# Patient Record
Sex: Male | Born: 1940 | Race: White | Hispanic: No | Marital: Married | Smoking: Former smoker
Health system: Southern US, Community
[De-identification: ages and names within clinical notes are randomized; demographics above are authoritative.]

## PROBLEM LIST (undated history)

## (undated) DIAGNOSIS — I10 Essential (primary) hypertension: Secondary | ICD-10-CM

## (undated) HISTORY — PX: JOINT REPLACEMENT: SHX530

---

## 2005-10-25 ENCOUNTER — Ambulatory Visit: Payer: Self-pay | Admitting: Gastroenterology

## 2005-11-08 ENCOUNTER — Ambulatory Visit: Payer: Self-pay | Admitting: Gastroenterology

## 2006-09-23 ENCOUNTER — Inpatient Hospital Stay (HOSPITAL_COMMUNITY): Admission: RE | Admit: 2006-09-23 | Discharge: 2006-09-26 | Payer: Self-pay | Admitting: Orthopedic Surgery

## 2006-12-01 ENCOUNTER — Emergency Department (HOSPITAL_COMMUNITY): Admission: EM | Admit: 2006-12-01 | Discharge: 2006-12-01 | Payer: Self-pay | Admitting: Family Medicine

## 2007-07-23 ENCOUNTER — Inpatient Hospital Stay (HOSPITAL_COMMUNITY): Admission: RE | Admit: 2007-07-23 | Discharge: 2007-07-25 | Payer: Self-pay | Admitting: Orthopedic Surgery

## 2007-11-26 ENCOUNTER — Inpatient Hospital Stay (HOSPITAL_COMMUNITY): Admission: RE | Admit: 2007-11-26 | Discharge: 2007-11-29 | Payer: Self-pay | Admitting: Orthopedic Surgery

## 2008-01-06 ENCOUNTER — Encounter (INDEPENDENT_AMBULATORY_CARE_PROVIDER_SITE_OTHER): Payer: Self-pay | Admitting: Orthopedic Surgery

## 2008-01-06 ENCOUNTER — Inpatient Hospital Stay (HOSPITAL_COMMUNITY): Admission: RE | Admit: 2008-01-06 | Discharge: 2008-01-10 | Payer: Self-pay | Admitting: Orthopedic Surgery

## 2009-01-29 IMAGING — CR DG CHEST 1V PORT
1 series · 1 of 1 positions shown · non-contrast
Comparison: 11/26/2007

CLINICAL DATA: Postop from left knee arthroplasty.  PICC line
placement.

PORTABLE CHEST - 1 VIEW

[view not recorded]
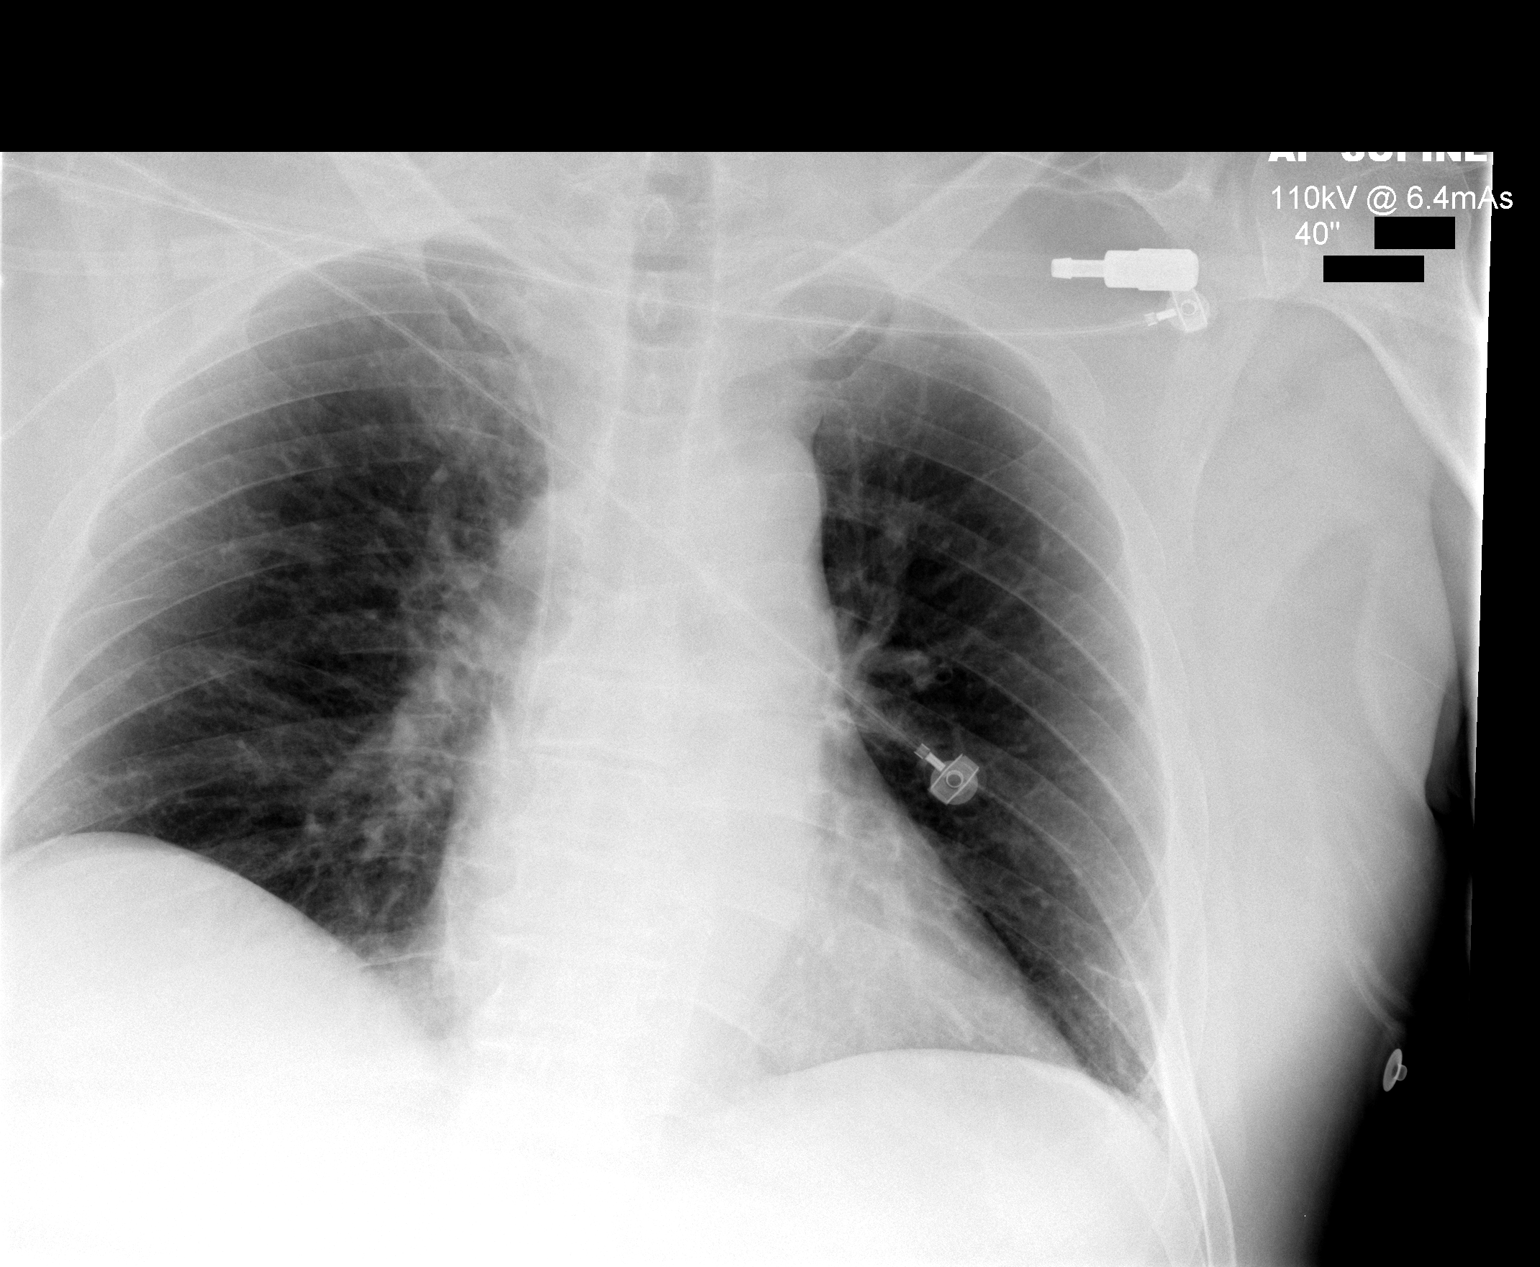

[1 of 1 positions shown; findings below may reference images not displayed]

FINDINGS: Right arm PICC line tip is seen in the expected region of
the cavoatrial junction.

Both lungs are clear.  Heart size is within normal limits.
IMPRESSION: No acute findings.  PICC line tip overlies the cavoatrial junction.

## 2009-06-23 ENCOUNTER — Emergency Department (HOSPITAL_COMMUNITY): Admission: EM | Admit: 2009-06-23 | Discharge: 2009-06-23 | Payer: Self-pay | Admitting: Emergency Medicine

## 2009-06-29 ENCOUNTER — Emergency Department (HOSPITAL_COMMUNITY): Admission: EM | Admit: 2009-06-29 | Discharge: 2009-06-29 | Payer: Self-pay | Admitting: Emergency Medicine

## 2009-06-30 ENCOUNTER — Inpatient Hospital Stay (HOSPITAL_COMMUNITY): Admission: EM | Admit: 2009-06-30 | Discharge: 2009-07-01 | Payer: Self-pay | Admitting: Emergency Medicine

## 2009-09-01 ENCOUNTER — Ambulatory Visit (HOSPITAL_COMMUNITY): Admission: RE | Admit: 2009-09-01 | Discharge: 2009-09-02 | Payer: Self-pay | Admitting: General Surgery

## 2009-09-01 ENCOUNTER — Encounter (INDEPENDENT_AMBULATORY_CARE_PROVIDER_SITE_OTHER): Payer: Self-pay | Admitting: General Surgery

## 2010-05-21 LAB — DIFFERENTIAL
Basophils Absolute: 0 10*3/uL (ref 0.0–0.1)
Basophils Relative: 0 % (ref 0–1)
Eosinophils Absolute: 0.1 10*3/uL (ref 0.0–0.7)
Monocytes Relative: 10 % (ref 3–12)
Neutro Abs: 3.8 10*3/uL (ref 1.7–7.7)
Neutrophils Relative %: 62 % (ref 43–77)

## 2010-05-21 LAB — COMPREHENSIVE METABOLIC PANEL
ALT: 21 U/L (ref 0–53)
Albumin: 3.8 g/dL (ref 3.5–5.2)
BUN: 19 mg/dL (ref 6–23)
GFR calc non Af Amer: >60 mL/min (ref >60)
Potassium: 4.4 mEq/L (ref 3.5–5.1)
Sodium: 138 mEq/L (ref 135–145)
Total Bilirubin: 0.8 mg/dL (ref 0.3–1.2)
Total Protein: 6.1 g/dL (ref 6.0–8.3)

## 2010-05-21 LAB — CBC
MCHC: 34 g/dL (ref 30.0–36.0)
MCV: 92.7 fL (ref 78.0–100.0)
RBC: 5.34 MIL/uL (ref 4.22–5.81)
RDW: 13.8 % (ref 11.5–15.5)
WBC: 6.2 10*3/uL (ref 4.0–10.5)

## 2010-05-23 LAB — COMPREHENSIVE METABOLIC PANEL
Albumin: 3.2 g/dL — ABNORMAL LOW (ref 3.5–5.2)
Albumin: 3.8 g/dL (ref 3.5–5.2)
Alkaline Phosphatase: 60 U/L (ref 39–117)
Alkaline Phosphatase: 64 U/L (ref 39–117)
BUN: 16 mg/dL (ref 6–23)
BUN: 18 mg/dL (ref 6–23)
CO2: 25 mEq/L (ref 19–32)
CO2: 28 mEq/L (ref 19–32)
Calcium: 9.8 mg/dL (ref 8.4–10.5)
Chloride: 101 mEq/L (ref 96–112)
Chloride: 101 mEq/L (ref 96–112)
Chloride: 102 mEq/L (ref 96–112)
Creatinine, Ser: 1.04 mg/dL (ref 0.4–1.5)
GFR calc non Af Amer: >60 mL/min (ref >60)
Glucose, Bld: 103 mg/dL — ABNORMAL HIGH (ref 70–99)
Glucose, Bld: 86 mg/dL (ref 70–99)
Potassium: 4 mEq/L (ref 3.5–5.1)
Potassium: 4.2 mEq/L (ref 3.5–5.1)
Sodium: 133 mEq/L — ABNORMAL LOW (ref 135–145)
Total Bilirubin: 1.1 mg/dL (ref 0.3–1.2)
Total Bilirubin: 1.2 mg/dL (ref 0.3–1.2)
Total Bilirubin: 1.2 mg/dL (ref 0.3–1.2)
Total Protein: 7 g/dL (ref 6.0–8.3)

## 2010-05-23 LAB — COMPREHENSIVE METABOLIC PANEL WITH GFR
ALT: 32 U/L (ref 0–53)
ALT: 51 U/L (ref 0–53)
AST: 27 U/L (ref 0–37)
AST: 47 U/L — ABNORMAL HIGH (ref 0–37)
Alkaline Phosphatase: 76 U/L (ref 39–117)
BUN: 19 mg/dL (ref 6–23)
CO2: 23 meq/L (ref 19–32)
Calcium: 8.7 mg/dL (ref 8.4–10.5)
Creatinine, Ser: 0.83 mg/dL (ref 0.4–1.5)
Creatinine, Ser: 0.91 mg/dL (ref 0.4–1.5)
GFR calc Af Amer: >60 mL/min (ref >60)
GFR calc Af Amer: >60 mL/min (ref >60)
GFR calc non Af Amer: >60 mL/min (ref >60)
GFR calc non Af Amer: >60 mL/min (ref >60)
Glucose, Bld: 113 mg/dL — ABNORMAL HIGH (ref 70–99)
Sodium: 134 meq/L — ABNORMAL LOW (ref 135–145)
Total Protein: 6.2 g/dL (ref 6.0–8.3)

## 2010-05-23 LAB — DIFFERENTIAL
Basophils Absolute: 0 10*3/uL (ref 0.0–0.1)
Basophils Absolute: 0.1 10*3/uL (ref 0.0–0.1)
Basophils Relative: 0 % (ref 0–1)
Basophils Relative: 1 % (ref 0–1)
Basophils Relative: 2 % — ABNORMAL HIGH (ref 0–1)
Eosinophils Absolute: 0.1 10*3/uL (ref 0.0–0.7)
Eosinophils Relative: 1 % (ref 0–5)
Lymphocytes Relative: 12 % (ref 12–46)
Lymphocytes Relative: 19 % (ref 12–46)
Lymphocytes Relative: 6 % — ABNORMAL LOW (ref 12–46)
Lymphs Abs: 1.1 K/uL (ref 0.7–4.0)
Monocytes Absolute: 0.7 10*3/uL (ref 0.1–1.0)
Monocytes Absolute: 0.8 10*3/uL (ref 0.1–1.0)
Monocytes Relative: 5 % (ref 3–12)
Monocytes Relative: 8 % (ref 3–12)
Neutro Abs: 13.9 10*3/uL — ABNORMAL HIGH (ref 1.7–7.7)
Neutro Abs: 5.1 10*3/uL (ref 1.7–7.7)
Neutro Abs: 6.9 10*3/uL (ref 1.7–7.7)
Neutrophils Relative %: 78 % — ABNORMAL HIGH (ref 43–77)
Neutrophils Relative %: 86 % — ABNORMAL HIGH (ref 43–77)

## 2010-05-23 LAB — CBC
HCT: 43.5 % (ref 39.0–52.0)
HCT: 47.5 % (ref 39.0–52.0)
Hemoglobin: 15.4 g/dL (ref 13.0–17.0)
Hemoglobin: 16.8 g/dL (ref 13.0–17.0)
MCHC: 35.5 g/dL (ref 30.0–36.0)
MCV: 91.1 fL (ref 78.0–100.0)
MCV: 92.1 fL (ref 78.0–100.0)
Platelets: 175 10*3/uL (ref 150–400)
Platelets: 243 10*3/uL (ref 150–400)
RBC: 4.72 MIL/uL (ref 4.22–5.81)
RBC: 5.22 MIL/uL (ref 4.22–5.81)
RBC: 5.8 MIL/uL (ref 4.22–5.81)
RDW: 12.4 % (ref 11.5–15.5)
RDW: 13.5 % (ref 11.5–15.5)
WBC: 7.5 10*3/uL (ref 4.0–10.5)
WBC: 8.9 10*3/uL (ref 4.0–10.5)

## 2010-05-23 LAB — URINALYSIS, ROUTINE W REFLEX MICROSCOPIC
Glucose, UA: NEGATIVE mg/dL
Glucose, UA: NEGATIVE mg/dL
Hgb urine dipstick: NEGATIVE
Ketones, ur: 15 mg/dL — AB
Nitrite: NEGATIVE
Nitrite: NEGATIVE
Protein, ur: NEGATIVE mg/dL
Specific Gravity, Urine: 1.04 — ABNORMAL HIGH (ref 1.005–1.030)
Urobilinogen, UA: 0.2 mg/dL (ref 0.0–1.0)
Urobilinogen, UA: 1 mg/dL (ref 0.0–1.0)
pH: 5.5 (ref 5.0–8.0)

## 2010-05-23 LAB — TSH: TSH: 2.356 u[IU]/mL (ref 0.350–4.500)

## 2010-05-23 LAB — TROPONIN I: Troponin I: 0.01 ng/mL (ref 0.00–0.06)

## 2010-05-23 LAB — POCT I-STAT, CHEM 8
BUN: 22 mg/dL (ref 6–23)
Chloride: 105 mEq/L (ref 96–112)
Glucose, Bld: 119 mg/dL — ABNORMAL HIGH (ref 70–99)
HCT: 57 % — ABNORMAL HIGH (ref 39.0–52.0)
Hemoglobin: 19.4 g/dL — ABNORMAL HIGH (ref 13.0–17.0)

## 2010-05-23 LAB — LACTIC ACID, PLASMA: Lactic Acid, Venous: 1.2 mmol/L (ref 0.5–2.2)

## 2010-05-23 LAB — CK TOTAL AND CKMB (NOT AT ARMC)
CK, MB: 4.4 ng/mL — ABNORMAL HIGH (ref 0.3–4.0)
Relative Index: INVALID (ref 0.0–2.5)
Total CK: 34 U/L (ref 7–232)

## 2010-05-23 LAB — LIPASE, BLOOD
Lipase: 50 U/L (ref 11–59)
Lipase: 69 U/L — ABNORMAL HIGH (ref 11–59)

## 2010-05-23 LAB — MAGNESIUM: Magnesium: 1.5 mg/dL (ref 1.5–2.5)

## 2010-07-18 NOTE — H&P (Signed)
[name], [name]                ACCOUNT NO.:  [id number]   MEDICAL RECORD NO.:  [id number]          PATIENT TYPE:  INP   LOCATION:  NA                           FACILITY:  [facility]   PHYSICIAN:  Ollen Gross, M.D.    DATE OF BIRTH:  07/20/1940   DATE OF ADMISSION:  11/26/2007  DATE OF DISCHARGE:                              HISTORY & PHYSICAL   CHIEF COMPLAINT:  Infected left total knee.   HISTORY OF PRESENT ILLNESS:  The patient is a 70 year old male well-  known to Dr. Ollen Gross, having previously undergone a left total  knee in the past.  Unfortunately, he had developed an infection and he  most recently underwent a poly exchange revision back in May 2009.  He  was treated with IV antibiotics during that time.  Unfortunately, his  course was complicated by a PICC line infection which grew out  Klebsiella pneumoniae.  He has been treated for that.  The knee  continues to have recurrent effusions and hemarthrosis requiring  aspirations.  It is felt to be due to a recurrent infection.  It is felt  he would best be served by undergoing a two-stage procedure and is  subsequently admitted for the first stage of the resection arthroplasty.  Risks and benefits have been discussed.  He elects to proceed with  surgery.   ALLERGIES:  No known drug allergies.   CURRENT MEDICATIONS:  Metoprolol, pravastatin, fish oil, baby aspirin,  Mobic, Tylenol, omeprazole, lisinopril.   PAST MEDICAL HISTORY:  1. Hypertension.  2. Chronic reflux esophagitis.  3. Hyperlipidemia.  4. Past history of Helicobacter pylori.  5. Recent history of a PICC line infection of Klebsiella pneumoniae      back in July 2009.   PAST SURGICAL HISTORY:  1. Left total knee arthroplasty, July 2008.  2. Poly exchange revision in May 2009.   SOCIAL HISTORY:  Married, retired.  Nonsmoker.  No alcohol.  Five  children.   FAMILY HISTORY:  Father with a history of hypertension.  Family history  also of heart  disease and cancer.   REVIEW OF SYSTEMS:  No fevers or chills, no night sweats.  Occasional  fatigue.  NEURO:  No seizure, syncope or paralysis.  RESPIRATORY:  No  shortness of breath, productive cough or hemoptysis.  CARDIOVASCULAR:  No chest pain, no orthopnea.  GI:  No nausea, vomiting, diarrhea or  constipation.  GU:  No dysuria, hematuria or discharge.  MUSCULOSKELETAL:  Left knee.   PHYSICAL EXAMINATION:  VITAL SIGNS:  Pulse 74, respirations 12, blood  pressure 118/72.  GENERAL:  A 70 year old white male, well-nourished, well-developed,  large frame, overweight, in no acute distress, accompanied by his wife.  HEENT:  Normocephalic, atraumatic.  Pupils equal, round, reactive.  Oropharynx clear.  EOMs intact.  NECK:  Supple.  CHEST:  Clear anterior and posterior chest wall.  No rhonchi, rales or  wheezing.  On the left lateral anterior chest wall up near the axilla of  the left arm he had a superficial boil recently.  This was lanced.  Still has an open  area with eschar and a small ara about 1 x 1.5 cm that  still has a superficial ulceration that is not completely healed at the  time of H&P (please note, he is going back to see his dermatologist  prior to his surgery).  HEART:  Regular rate and rhythm without murmur.  ABDOMEN:  Soft, round, protuberant abdomen.  Bowel sounds present.  RECTAL, BREASTS, GENITALIA:  Not done, not pertinent to present illness.  EXTREMITIES:  Left knee:  Massive effusion, warm to the touch.  Range of  motion 10 to 115.   IMPRESSION:  Infected left total knee.   PLAN:  The patient admitted to [facility] to undergo a  resection arthroplasty.  Surgery will be performed by Dr. Ollen Gross.      Alexzandrew L. Perkins, P.A.C.      Ollen Gross, M.D.  Electronically Signed    ALP/MEDQ  D:  11/25/2007  T:  11/26/2007  Job:  [id number]

## 2010-07-18 NOTE — H&P (Signed)
[name], [name]                ACCOUNT NO.:  [id number]   MEDICAL RECORD NO.:  [id number]          PATIENT TYPE:  INP   LOCATION:  NA                           FACILITY:  [facility]   PHYSICIAN:  Ollen Gross, M.D.    DATE OF BIRTH:  27-Feb-1941   DATE OF ADMISSION:  01/06/2008  DATE OF DISCHARGE:                              HISTORY & PHYSICAL   CHIEF COMPLAINT:  [name] is a 70 year old gentleman well-known to  Dr. Lequita Halt.  He has had an infected left total knee arthroplasty that  was removed earlier this summer.  Patient has been on vancomycin.  He is  currently being readmitted for surgical evaluation of the left knee and  possible reimplantation of his left total knee arthroplasty.   ALLERGIES:  NO KNOWN DRUG ALLERGIES.   CURRENT MEDICATIONS:  Metoprolol, pravastatin, Coumadin, Tylenol,  omeprazole, Vicodin and lisinopril.   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  2. Hypertension.  3. Reflux.  4. Hyperlipidemia  5. History of H.  Pylori.   PAST SURGICAL HISTORY:  Left total knee arthroplasty in July 2008.  He  had an infection in the knee in May 2009.  He was taken to the OR where  it was surgically debrided, had polyethylene exchange.  He subsequently  came back reinfected in September.  He was taken back to the OR where  the components were removed.  He had a polymethyl methacrylate spacer  with vancomycin impregnated and vancomycin IV since, and he is ready for  reimplantation.   SOCIAL HISTORY:  He is married.  He is a nonsmoker.  No alcohol.  Five  children, lives with his wife.   FAMILY MEDICAL HISTORY:  Father has hypertension.  Mother has heart  disease.   REVIEW OF SYSTEMS:  At this time, negative for any fevers or chills.  No  other recent illnesses.  He denies any chest pain, shortness of breath.  He denies any GI upset.  He has absolutely no symptoms related to any  flu.   PHYSICAL EXAMINATION:  VITAL SIGNS:   Temperature is 97.9, pulse was 70,  respirations 12, blood pressure was 110/68.  GENERAL:  This is a healthy-appearing gentleman, conscious, alert and  appropriate in a wheelchair.  He has left leg in an immobilizer brace,  appears to be in no distress.  Marland Kitchen  HEENT: Head was normocephalic.  Pupils equal, round and reactive.  Gross  hearing is intact.  NECK:  Supple.  Good range of motion.  CHEST:  Had clear lung sounds throughout.  No wheezes, rales, rhonchi.  HEART:  Regular rate and rhythm.  No murmurs.  ABDOMEN:  Soft, nontender.  Bowel sounds present.  EXTREMITIES:  Upper extremities had good range of motion of shoulders,  elbows and wrists.  He had a right-sided PICC-line placed.  LOWER EXTREMITIES:  Left knee was round, boggy appearing.  He had  dermatologic pigmentation changes about the knee, was slightly warmer  than the right.  There was no signs of infection.  It was in a knee  immobilizer.  His calf was soft and nontender.  His right knee had  normal motion.  PERIPHERAL VASCULAR:  Radial pulses were 2+, dorsalis pedis pulses were  1+.  NEUROLOGICAL:  The patient was conscious, alert and appropriate.   IMPRESSION:  1. History of infection of left total knee arthroplasty with recent      removal of the hardware with polymethyl methacrylate spacer and      vancomycin impregnated on vancomycin since mid September.  2. Hypertension.  3. Reflux disease.  4. Hyperlipidemia  5. History of H. Pylori.   PLAN:  The patient will be readmitted to [facility] under the  care of Dr. Ollen Gross.  The patient has undergone all routine labs  and tests.  The patient will have a surgical reevaluation of his left  knee with possible reimplantation of his left total knee arthroplasty by  Dr. Lequita Halt at [facility] on January 06, 2008.      Jamelle Rushing, P.A.      Ollen Gross, M.D.  Electronically Signed    RWK/MEDQ  D:  01/01/2008  T:  01/01/2008  Job:   [id number]   cc:   Ollen Gross, M.D.  Fax: [phone]

## 2010-07-18 NOTE — Op Note (Signed)
[name], [name]                ACCOUNT NO.:  [id number]   MEDICAL RECORD NO.:  [id number]          PATIENT TYPE:  AMB   LOCATION:  DAY                          FACILITY:  [facility]   PHYSICIAN:  Ollen Gross, M.D.    DATE OF BIRTH:  Jul 22, 1940   DATE OF PROCEDURE:  07/23/2007  DATE OF DISCHARGE:                               OPERATIVE REPORT   PREOPERATIVE DIAGNOSIS:  Infected left total knee arthroplasty.   POSTOPERATIVE DIAGNOSIS:  Infected left total knee arthroplasty.   PROCEDURE:  Left knee irrigation and debridement with tibial  polyethylene exchange.   SURGEON:  Ollen Gross, M.D.   ASSISTANT:  Alexzandrew L. Perkins, P.A.C.   ANESTHESIA:  Spinal.   ESTIMATED BLOOD LOSS:  Minimal.   DRAINS:  Hemovac x1.   TOURNIQUET TIME:  Thirty-three minutes at 300 mmHg.   COMPLICATIONS:  None.   CONDITION:  Stable to recovery.   BRIEF CLINICAL NOTE:  [name] a 70 year old male who had a left total  knee arthroplasty performed last year.  He did fairly well initially.  He had one episode of swelling over the winter.  We aspirated the knee,  and all studies came back negative.  This quickly resolved.  He  presented to the office last week with an acutely swollen knee.  I  aspirated it, and it had bloody content.  Initial Gram's stain showed no  organisms.  His white blood cell count was only slightly elevated.  He  had recurrent swelling as of yesterday and came back to the office.  We  did get the culture results back yesterday showing coag-negative staph.  He Mcgee subsequently admitted to the hospital and taken to the operating  room today for irrigation and debridement and tibial polyethylene  exchange in an attempt to salvage the prosthesis.  We did discuss that  the most effective way of eradicating the infection was a two-stage  revision.  We are going to attempt to salvage the prosthesis via this  manner with I&D and then IV antibiotics.  If successful, we have avoided  the two-stage procedure.   PROCEDURE IN DETAIL:  After successful administration of spinal  anesthetic, a tourniquet Mcgee placed high on the left thigh, and left  lower extremity prepped and draped in the usual sterile fashion.  The  extremity Mcgee wrapped in esmarch, and the tourniquet inflated to 200  mmHg.  A midline incision Mcgee made with a 10 blade through the  subcutaneous tissues to the level of the extensor mechanism.  A fresh  blade Mcgee used to make a medial parapatellar arthrotomy.  We encountered  fluid and then sent this for Gram's stain and C&S.  There Mcgee a fair  amount of fibrinous debris in the joint, which Mcgee thoroughly debrided.  There was also evidence of some scar tissue on the suprapatellar area,  which Mcgee also thoroughly debrided.  When I got back to normal-appearing  tissue, then I elevated the soft tissue on the proximal medial tibia to  the joint line and with a knife into the semimembranosus bursa with a  Cobb elevator.  I was able to evert the patella and flex the knee 90  degrees.  I was also to remove the tibial polyethylene and behind the  knee there was also a fair amount of fibrinous debris, which I removed  back to normal-appearing tissue.  We then thoroughly irrigated the joint  with about 3 liters of pulsatile lavage.  I thoroughly cleansed the  tibial and femoral metal surfaces.  There was no evidence of any gross  film layer over them.  We then placed the new 12.5 mm size 6 tibial  polyethylene with a tibial tray and reduced the knee.  He had full  extension with excellent balance with full range of motion.  I irrigated  it further with saline solution with a pulsatile lavage.  I then placed  a Hemovac drain and closed the arthrotomy with running #2 Quill suture.  Flexion against gravity Mcgee at 130 degrees.  The tourniquet Mcgee then  released for a total time of 33 minutes.  Drain Mcgee hooked to suction.  The subcu Mcgee then closed with interrupted 2-0 Vicryl and  the skin with  staples.  A bulky sterile dressing Mcgee applied, and the knee Mcgee placed  into a knee immobilizer, awakened, and transported to recovery in stable  condition.      Ollen Gross, M.D.  Electronically Signed     FA/MEDQ  D:  07/23/2007  T:  07/23/2007  Job:  [id number]

## 2010-07-18 NOTE — Op Note (Signed)
[name], [name]                ACCOUNT NO.:  [id number]   MEDICAL RECORD NO.:  [id number]          PATIENT TYPE:  INP   LOCATION:  1603                         FACILITY:  [facility]   PHYSICIAN:  Ollen Gross, M.D.    DATE OF BIRTH:  03-02-1941   DATE OF PROCEDURE:  11/26/2007  DATE OF DISCHARGE:                               OPERATIVE REPORT   PREOPERATIVE DIAGNOSIS:  Infected left total knee arthroplasty.   POSTOPERATIVE DIAGNOSIS:  Infected left total knee arthroplasty.   PROCEDURE:  Left knee resection arthroplasty with placement of  antibiotic spacer.   SURGEON:  Ollen Gross, M.D.   ASSISTANT:  Alexzandrew L. Perkins, P.A.C.   ANESTHESIA:  Spinal with Duramorph.   ESTIMATED BLOOD LOSS:  Minimal.   DRAINS:  Hemovac x1.   TOURNIQUET TIME:  60 minutes at 300 mmHg.   COMPLICATIONS:  None.   CONDITION:  Stable to recovery.   BRIEF CLINICAL NOTE:  [name] is a 70 year old male, had a left total knee  arthroplasty done approximately a year ago.  Did pretty well initially  and then around June of this year developed an acute infection.  We  treated this with open irrigation and debridement and polyethylene  exchange followed by IV antibiotics.  He did fine until a few weeks ago,  when he started to have increased swelling.  He had one aspiration which  showed no organisms and a second aspiration which showed did show rare  staph species.  He presents now for resection arthroplasty of the knee.   PROCEDURE IN DETAIL:  The the successful administration of a spinal  anesthetic, a tourniquet is placed high on his left thigh and left lower  extremity prepped and draped in the usual sterile fashion.  The  extremity is wrapped in Esmarch, the tourniquet inflated to 300 mmHg.  Midline incision is made with a 10 blade through the subcutaneous tissue  to the extensor mechanism.  A fresh blade is used make a medial  parapatellar arthrotomy.  A tremendous amount of fluid and hematoma  in  the knee.  This is sent for Gram stain, C and S.  The knee is thoroughly  washed out and exuberant hypertrophic scar and synovium is excised down  to more normal-appearing tissue.  I then flex the knee 90 degrees and  cut the spine of the tibial polyethylene so I was able to remove the  polyethylene from the tibial tray.  I then disrupted the interface  between the femoral component and bone.  This was very well-fixed.  We  were able to get the femoral component off with minimal bone loss.  I  then removed the cement from the distal femur.  On the tibial side we  subluxed the tibia forward with retractors and then used an oscillating  saw to disrupt the interface between the tibial component and bone.  The  tibial component is removed with no bone loss.  I then took the  oscillating saw and cut about a millimeter below the cement to get to  normal cancellous bone.  We removed the cement  from the tibial  metaphysis also.  I then thoroughly irrigated the tibial and femoral  canal and scraped them out and there was no evidence of any abnormal  tissue.  We then everted the patella and cut underneath the patella with  an oscillating saw, removing the component and all the cement.  The 3  plugs were then removed with a rongeur.  We then thoroughly irrigated  the entire knee with about 3 L of saline.  Thorough synovectomy is  completed.  At this time 3 batches of cement are mixed with 3 g of  vancomycin and 2.4 g of tobramycin.  I measured the width, depth and  height of the space in extension and formulated an antibiotic spacer to  meet these configurations.  Once the cement was hardened then I placed  the spacer into the joint space, which led to a stable joint space.  The  wound was further irrigated with saline and a Hemovac drain placed.  The  arthrotomy was closed with interrupted #1 PDS.  Tourniquet was then  released with a total time of 60 minutes.  Very minimal bleeding noted.   Subcu was closed with interrupted 2-0 Vicryl and skin with staples.  Drain is hooked to suction, incision cleaned and dried and a bulky  sterile dressing applied.  He is then placed into a knee immobilizer,  awakened and transported to recovery in stable condition.      Ollen Gross, M.D.  Electronically Signed     FA/MEDQ  D:  11/26/2007  T:  11/27/2007  Job:  [id number]

## 2010-07-18 NOTE — Op Note (Signed)
NAMESHAQUAN, Wesley                ACCOUNT NO.:  [id number]   MEDICAL RECORD NO.:  [id number]          PATIENT TYPE:  INP   LOCATION:  1234                         FACILITY:  [facility]   PHYSICIAN:  Ollen Gross, M.D.    DATE OF BIRTH:  Feb 17, 1941   DATE OF PROCEDURE:  01/06/2008  DATE OF DISCHARGE:                               OPERATIVE REPORT   PREOPERATIVE DIAGNOSIS:  Resection arthroplasty left knee.   POSTOPERATIVE DIAGNOSIS:  Resection arthroplasty left knee.   PROCEDURE:  Left total knee arthroplasty reimplantation.   SURGEON:  Ollen Gross, M.D.   ASSISTANT:  Avel Peace PA-C   ANESTHESIA:  Spinal with Duramorph.   ESTIMATED BLOOD LOSS:  200 mL.   DRAIN:  Hemovac times one.   TOURNIQUET TIME:  Up 55 minutes at 300 mmHg, down 8 minutes, up  additional 35 minutes at 300 mmHg.   COMPLICATIONS:  None.   CONDITION:  Stable to recovery.   BRIEF CLINICAL NOTE:  [name] is a 70 year old male who had a left total  knee arthroplasty done over a year ago.  He has had an infection and  initially had an irrigation debridement and then polyethylene exchange.  Unfortunately, the infection recurred and he underwent resection  arthroplasty with placement of antibiotic spacer 8 weeks ago.  He  presents now for total knee arthroplasty reimplantation.   PROCEDURE IN DETAIL:  After successful administration of spinal  anesthetic, a tourniquet was placed high on the left thigh and left  lower extremity prepped and draped in the usual sterile fashion.  Extremity was wrapped in Esmarch and tourniquet inflated 300 mmHg.  Midline incision made with 10 blade through subcutaneous tissue to the  extensor mechanism.  Fresh blade is used to make a median parapatellar  arthrotomy.  Hematoma was encountered and that fluid is sent for stat  Gram stain which is negative for organisms, with rare WBCs.  I did a  thorough scar excision to free up the medial and lateral gutters and  suprapatellar  area.  I was then able to sublux the patella laterally.  I  removed the antibiotic spacer in a piecemeal fashion using osteotomes.  We then sent three specimens of synovium for frozen section.  These were  the most inflamed appearing specimens in the knee.  All came back with  no acute inflammation.  We proceeded with completing the soft tissue  debridement.  I then flexed the knee at 90 degrees and irrigated out  both the tibial and femoral canal and reamed the femoral canal to 22 mm,  tibial canal to 13 mm.   We used the extramedullary tibial cutting guide and resected about 2 mm  off the tibial surface.  I then took the size 5 MBT revision tray and  prepared proximally with the modular drill with a 13 x 30 stem extension  on that.  We then did the keel punch, had an excellent fit on the tibia.  The tibial trial was then removed.  We then placed the 22 mm reamer into  the femoral side to serve as our intramedullary  cutting guide.  A 5  degrees left valgus alignment guide is placed and I pinned the block to  do 2 mm off of the femur.  This barely took any bone, so I went into the  +4 position and I resected that off of the distal femur.  We thus are  going to do 4 mm distal augments medial and lateral.  The sizing block  is placed.  Size 5 is most appropriate.  We placed the size 5 cutting  block in the +2 position to effectively raise the stem and lower the  component so we would be on the anterior cortex of the femur with the  anterior cut.  The spacer blocks placed to gauge our rotation in 90  degrees of flexion to create a rectangular flexion space.  We then used  the AP cutting block to do our resection for the size 5.  Then the  revision block is then placed to complete the intercondylar chamfer  cuts.  We then placed the size 5, TC3 femur with a 22 x 75 stem  extension in +2 position with 4 mm medial and lateral distal augments.  We placed the spacers 15 mm at first but there was  a little play at the  15, so I went to 17.5 which allowed for full extension with excellent  varus-valgus, anterior-posterior balance throughout full range of  motion.  The patella was everted and composite thickness was about 18  mm.  I resected down to 14 mm which included some soft tissue coming off  the patella.  We then placed a 41 template and drilled the lug holes.  Trial patella was placed and it tracks normally.  I then released the  tourniquet for initial tourniquet time of 55 minutes.  Moist sponge is  placed.  The bleeding that is encountered is basically ooze from the  scar tissue and we just packed the sponge in and waited approximately 8  minutes.  After 8 minutes we wrapped the leg in Esmarch and reinflated  the tourniquet to 300.  While the tourniquet was down, the components  were assembled on the back table.   The wound was then copiously irrigated with saline solution with  pulsatile lavage.  Cement restrictor trial was placed and size 5 is most  appropriate.  It is placed at the appropriate depth in the tibial canal.  We further irrigated and mixed the cement.  Once the cement is ready, it  is injected into the tibial canal and the tibial component is impacted  and all extruded cement removed.  On the femoral side, we cemented  distally but did not cement the stem as it was press fit.  This was also  impacted into position and extruded cement removed.  Trial 17.5-mm  spacer is placed, knee held in full extension and the rest of the  extruded cement removed.  The patella is cemented into place, 41 patella  and held with the patellar clamp.  Once cement is fully hardened, then  we removed the trial placed the permanent 17.5 mm TC3 rotating platform  insert into the tibial tray.  Wound was then copiously irrigated saline  solution and the arthrotomy closed over Hemovac drain with interrupted  #1 PDS.  Flexion against gravity was 100 degrees with normal tracking of  the  patella.  Subcu was also closed over Hemovac drain with interrupted  2-0 Vicryl.  We then released tourniquet for the second time of 35  minutes.  Skin was closed with staples.  Drains hooked to suction.  Incision cleaned and dried and a bulky sterile dressing applied.  He is  then awakened and transferred to recovery in stable condition.      Ollen Gross, M.D.  Electronically Signed     FA/MEDQ  D:  01/06/2008  T:  01/07/2008  Job:  [id number]

## 2010-07-18 NOTE — H&P (Signed)
[name], LATNER              ACCOUNT NO.:  [id number]   MEDICAL RECORD NO.:  [id number]          PATIENT TYPE:  INP   LOCATION:  NA                           FACILITY:  [facility]   PHYSICIAN:  Ollen Gross, M.D.    DATE OF BIRTH:  May 26, 1940   DATE OF ADMISSION:  09/23/2006  DATE OF DISCHARGE:                              HISTORY & PHYSICAL   DATE OF OFFICE VISIT HISTORY AND PHYSICAL:  September 20, 2006.   CHIEF COMPLAINT:  Left knee pain.   HISTORY OF PRESENT ILLNESS:  The patient is a 70 year old male who has  been seen by Dr. Lequita Halt.  He is referred over from Dr. Rodolph Bong for  evaluation of bilateral knee pain.  He has been seen and found to have  arthritis.  The left knee is more symptomatic and problematic.  It has  been ongoing for quite some time now.  He has had bilateral cortisone as  injections in the past which helped temporarily.  He has been  contemplating knee replacement for quite some time now, has been seen by  Dr. Lequita Halt, found have bone-on-bone changes in the medial compartment  of both knees.  The left knee is more advanced than the right.  Risks  and benefits have been discussed.  The patient has elected to proceed  with surgery at this time.   ALLERGIES:  No known drug allergies.   CURRENT MEDICATIONS:  Metoprolol, lisinopril, pravastatin, meclizine,  Prilosec.   PAST MEDICAL HISTORY:  1. Benign essential hypertension.  2. Chronic reflux esophagitis.  3. Dizziness.  4. Hyperlipidemia.  5. A past history of Helicobacter pylori infection.   PAST SURGICAL HISTORY:  Negative.   SOCIAL HISTORY:  Married, retired.  Nonsmoker.  No alcohol.  Five  children.  His wife will be assisting with care after surgery.  One-  story home with three steps.   FAMILY HISTORY:  Father with a history of hypertension.  Family history  of also heart disease and cancer.   REVIEW OF SYSTEMS:  GENERAL:  No fevers, chills, night sweats.  NEUROLOGIC:  No seizure, syncope  or paralysis.  RESPIRATORY:  No  shortness of breath, productive cough or hemoptysis.  CARDIOVASCULAR:  No chest pain, angina or orthopnea.  GI:  No nausea or vomiting,  diarrhea or constipation.  GU:  No dysuria, hematuria or discharge.  MUSCULOSKELETAL:  Left knee.   PHYSICAL EXAM:  VITAL SIGNS:  Pulse 68, respirations 14, blood pressure  140/84.  GENERAL:  A 70 year old white male, well-nourished, large frame,  overweight, no acute distress, accompanied by his wife.  HEENT: Normocephalic, atraumatic.  Pupils round and reactive.  Oropharynx clear.  EOMs intact.  NECK:  Supple.  CHEST:  Barrel-chested individual.  Breath sounds are present, anterior  and posterior chest walls.  HEART:  Regular rate and rhythm.  No murmur.  S1-S2 noted.  ABDOMEN:  Soft, round protuberant abdomen.  Bowel sounds present.  RECTAL, BREASTS, GENITALIA:  Not done, not pertinent to present illness.  EXTREMITIES:  Left knee.  Left knee shows range of motion 5-115.  Marked  crepitus is noted.  No instability.  Tender more medial than lateral.  Pulses intact.   IMPRESSION:  1. Osteoarthritis, left knee.  2. Benign essential hypertension.  3. Chronic reflux esophagitis.  4. Dizziness.  5. Hyperlipidemia.  6. History of Helicobacter pylori infection.   PLAN:  Patient admitted to [facility] to undergo a left total  knee replacement arthroplasty.  Surgery will be performed by Dr. Ollen Gross.      Alexzandrew L. Perkins, P.A.C.      Ollen Gross, M.D.  Electronically Signed    ALP/MEDQ  D:  09/22/2006  T:  09/23/2006  Job:  [id number]   cc:   Konrad Felix, MD  [facility]  [address]  [place], [place] [address]  FAX [phone]

## 2010-07-18 NOTE — Op Note (Signed)
NAMEALEXIZ, [name]              ACCOUNT NO.:  [id number]   MEDICAL RECORD NO.:  [id number]          PATIENT TYPE:  INP   LOCATION:  1617                         FACILITY:  [facility]   PHYSICIAN:  Ollen Gross, M.D.    DATE OF BIRTH:  April 16, 1940   DATE OF PROCEDURE:  09/23/2006  DATE OF DISCHARGE:                               OPERATIVE REPORT   PREOPERATIVE DIAGNOSIS:  Osteoarthritis left knee.   POSTOPERATIVE DIAGNOSIS:  Osteoarthritis left knee.   PROCEDURE:  Left total knee arthroplasty.   SURGEON:  Ollen Gross, M.D.   ASSISTANT:  Avel Peace PA-C.   ANESTHESIA:  Spinal.   ESTIMATED BLOOD LOSS:  Minimal.   DRAINS:  None.   TOURNIQUET TIME:  37 minutes at 300 mmHg.   COMPLICATIONS:  None.   CONDITION:  Stable to recovery.   CLINICAL NOTE:  Mr. Engen is a 70 year old male with severe end-stage  arthritis left knee with progressively worsening pain and dysfunction.  He has failed nonoperative management and presents for total knee  arthroplasty.   PROCEDURE IN DETAIL:  After successful administration of spinal  anesthetic, a tourniquet is placed on the left thigh and left lower  extremity prepped and draped in usual sterile fashion.  Extremity is  wrapped in Esmarch, knee flexed, tourniquet inflated to 300 mmHg.  Midline incision is made with a 10 blade through subcutaneous tissue to  the level of the extensor mechanism.  A fresh blade is used to make a  medial parapatellar arthrotomy.  Soft tissue over the proximal medial  tibia is subperiosteally elevated to the joint line with the knife and  into the semimembranosus bursa with a Cobb elevator.  Soft tissue  laterally is elevated with attention being paid to avoid the patellar  tendon on tibial tubercle.  The patella is subluxed laterally, knee  flexed 90 degrees, ACL and PCL are removed.  Drill is used to create a  starting hole in the distal femur and canal is thoroughly irrigated.  A  5 degree left valgus  alignment guide is placed and referencing off the  posterior condyles rotation is marked on a block pinned to remove 11 mm  off the distal femur.  Distal femoral resection is made with an  oscillating saw.  Sizing block is placed and size 6 is most appropriate.  Size 6 cutting block is placed and the anterior, posterior and chamfer  cuts are made.   Tibia is subluxed forward and menisci are removed.  Extramedullary  tibial alignment guide is placed referencing proximally at the medial  aspect of tibial tubercle and distally along the second metatarsal axis  and tibial crest.  The block is pinned to remove approximately 10 mm off  the nondeficient lateral side.  Tibial resection is made with an  oscillating saw.  Size 5 is most appropriate tibial component and the  proximal tibia prepared the modular drill and keel punch for a size 5.  Femoral preparation is completed with the intercondylar cut for the size  6.   Size 5 mobile bearing tibial trial, size 6 posterior stabilized femoral  trial and a 12.5 mm posterior stabilized rotating platform insert trial  are placed.  We tried a 10 first and the 10 had a little bit of laxity  so we went to 12.5 which allowed for full extension with excellent varus  and valgus balance throughout full range of motion.  Patella was everted  and thickness measured to be 26 mm.  Freehand resection is taken to 14  mm, 41 template is placed, lug holes are drilled, trial patella is  placed and it tracks normally.  Osteophytes are removed off the  posterior femur which trials are place and all trials removed and the  cut bone surfaces are prepared with pulsatile lavage.  Cement is mixed  and once ready for implantation a size 5 mobile bearing tibial tray,  size 6 posterior stabilized femur and 41 patella are cemented in place  and patella is held with the clamp.  Trial 12.5 insert is placed and  knee held in full extension and all extruded cement removed.   Once  cement is fully hardened, then the permanent 12.5 mm posterior  stabilized rotating platform insert is placed into the tibial tray.  Wound is  copiously irrigated with saline solution and the extensor  mechanism closed with interrupted #1 PDS.  Prior to this, I injected  FloSeal and deflated the tourniquet.  The joint was then irrigated and  then closed with interrupted #1 PDS.  Flexion against gravity is 140  degrees.  Subcu is then closed with interrupted 2-0 Vicryl, subcuticular  running 4-0 Monocryl.  The incision is  cleaned and dried and Steri-  Strips and bulky sterile dressing applied.  He is then placed into a  knee immobilizer, awakened and transported to recovery in stable  condition.      Ollen Gross, M.D.  Electronically Signed     FA/MEDQ  D:  09/23/2006  T:  09/24/2006  Job:  [id number]

## 2010-07-18 NOTE — Discharge Summary (Signed)
NAMEKURK, CORNIEL              ACCOUNT NO.:  [id number]   MEDICAL RECORD NO.:  [id number]          PATIENT TYPE:  INP   LOCATION:  1617                         FACILITY:  [facility]   PHYSICIAN:  Ollen Gross, M.D.    DATE OF BIRTH:  Mar 25, 1940   DATE OF ADMISSION:  09/23/2006  DATE OF DISCHARGE:  09/26/2006                               DISCHARGE SUMMARY   ADMITTING DIAGNOSES.:  1. Osteoarthritis left knee  2. Non-essential hypertension  3. Chronic reflux esophagitis  4. Dizziness.  5. Hyperlipidemia.  6. History of Helicobacter pylori infection.   DISCHARGE DIAGNOSES:  1. Osteoarthritis left knee status post left total knee replacement      arthroplasty  2. Mild hyponatremia  3. Non-essential hypertension  4. Chronic reflux esophagitis  5. Dizziness.  6. Hyperlipidemia.  7. History of Helicobacter pylori infection.   PROCEDURE:  09/23/2006,left total knee, surgeon Dr. Lequita Halt, assistant  Avel Peace PA-C.   CONSULTS:  None.   BRIEF HISTORY:  Mr. Casher is a 70 year old male with severe end-stage  arthritis of left knee with progressive worsening pain and dysfunction,  failed conservation management, now presents for total knee  arthroplasty.   LABORATORY DATA:  Preop CBC showed hemoglobin 17.3, hematocrit of 49.6,  white cell count 9.2 postop hemoglobin 14.9, last H&H 13.5, 38.1.  PT/PTT on admission 30.3 and 27 with an INR of 1.0.  Serial protimes  followed.  Last PT/INR 10.4, 1.6 Chem panel on admission slightly  elevated serum glucose 128.  Remaining Chem panel within normal limits.  Serial BMETs were followed.  Sodium did drop from 142 to 135, last noted  at 136.  Chest x-ray dated 09/20/06 no active disease.  EKG dated  08/16/2006 sinus rhythm, normal P axis rate 74, probably normal,  confirmed.   HOSPITAL COURSE:  The patient admitted to [facility],  tolerated seizure well, later transferred to recovery room on the  orthopedic floor.  Started on  PCA and p.o. analgesics for pain control  following surgery.  Did fairly well on the evening of surgery, doing  well on the morning of day one.  Started getting up out of bed with  therapy. Thigh was a little sore which was felt to be due to tourniquet,  encouraged p.o.'s pain medications also muscle relaxants.  Had a little  bit of low output on the morning of day one but it was adequate. We gave  him a little fluid challenge and his output increased.  By day two he  was doing much better.  He is started getting up with physical therapy,  walking 140 feet that morning and 200 feet that afternoon.  Dressing  change, incision looked good.  Sodium was down to about 133 but came  back up  the following day to 136.  He did well with this physical  therapy was ready to go home by the following day of 09/26/2006.   DISCHARGE/PLAN:  1. The patient was discharged home on 09/26/2006.  2. Discharge diagnoses please see above.  3. Discharge meds Percocet, Robaxin, Coumadin.  Diet low sodium  cardiac diet.  Follow-up 2 weeks.   DISPOSITION:  Home.  Activity:  Weightbearing as tolerated.  Home health  PT,  home health nursing for total knee protocol on the left   CONDITION ON DISCHARGE:  Improved.      Alexzandrew L. Perkins, P.A.C.      Ollen Gross, M.D.  Electronically Signed    ALP/MEDQ  D:  09/26/2006  T:  09/26/2006  Job:  [id number]   cc:   Wellness, fax:  [phone] Dr. Konrad Felix, [facility]  and

## 2010-07-18 NOTE — H&P (Signed)
NAMEMARCELLES, [name]                ACCOUNT NO.:  [id number]   MEDICAL RECORD NO.:  [id number]          PATIENT TYPE:  OIB   LOCATION:  1609                         FACILITY:  [facility]   PHYSICIAN:  Ollen Gross, M.D.    DATE OF BIRTH:  March 03, 1941   DATE OF ADMISSION:  07/23/2007  DATE OF DISCHARGE:                              HISTORY & PHYSICAL   CHIEF COMPLAINT:  Infected left total knee.   HISTORY OF PRESENT ILLNESS:  The patient is a 70 year old male.  Had a  left total knee performed back in July 2008.  He has done fairly well  but had one episode of swelling in the winter.  He had an aspiration  which turned out be negative, quickly resolved.  He was first seen in  the office on the day prior to admission, came back and had a recurring  of the swelling.  Culture results came back coagulase-negative staph.  Due to the positive culture, it was felt he would best be served by  admitting to the hospital for I&D and washout of the total knee.   ALLERGIES:  No known drug allergies.   CURRENT MEDICATIONS:  Metoprolol, pravastatin, fish oil, baby aspirin,  Mobic, Tylenol or Aleve, omeprazole p.r.n. and lisinopril.   PAST MEDICAL HISTORY:  1. Hypertension.  2. Chronic reflux esophagitis.  3. History of dizziness.  4. Hyperlipidemia.  5. Past history of Helicobacter pylori infection.   PAST SURGICAL HISTORY:  Left total knee in July 2008.   SOCIAL HISTORY:  Married, retired.  Nonsmoker.  No alcohol.  Five  children.   FAMILY HISTORY:  Father with a history of hypertension.  Family history  with also heart disease and cancer.   REVIEW OF SYSTEMS:  GENERAL:  No fevers, chills, night sweats, but he  has felt somewhat tired and experiencing pain with the knee.  NEUROLOGIC:  No seizure, syncope or paralysis.  RESPIRATORY:  No  shortness breath, productive cough or hemoptysis.  CARDIOVASCULAR:  No  chest pain, angina, orthopnea.  GI:  No nausea, vomiting, diarrhea or  constipation.   GU:  No dysuria, hematuria or discharge.  MUSCULOSKELETAL:  Left knee.   PHYSICAL EXAM:  VITAL SIGNS:  Temperature 97.3, pulse 86, respirations  16, blood pressure 120/76.  GENERAL:  The patient is a 70 year old white male, well-nourished, well-  developed, large frame, overweight, no acute distress.  Is in mild  discomfort due to his knee pain.  HEENT:  Normocephalic, atraumatic.  Pupils equal, round and reactive.  Oropharynx clear.  EOMs intact.  NECK:  Supple.  CHEST:  Barrel-chested individual.  breath sounds present anterior and  posterior chest walls.  HEART:  Regular rate and rhythm.  No murmur.  ABDOMEN:  Soft, round, protuberant abdomen.  Bowel sounds present.  RECTAL, BREASTS, GENITALIA:  Not done, not pertinent to present illness.  EXTREMITIES:  Left knee:  The left knee shows near-full extension with  flexion at about 105 degrees.  Does note to have swelling.  Incision is  well-healed.   IMPRESSION:  1. Infected left total knee.  2. Hypertension.  3. Chronic reflux esophagitis.  4. History of dizziness.  5. Hyperlipidemia.  6. History of Helicobacter pylori.   PLAN:  The patient admitted to [facility], will undergo  irrigation and debridement of the infected left total knee with poly  exchange.      Alexzandrew L. Perkins, P.A.C.      Ollen Gross, M.D.  Electronically Signed    ALP/MEDQ  D:  07/24/2007  T:  07/24/2007  Job:  [id number]

## 2010-07-21 NOTE — Discharge Summary (Signed)
NAMEROLLINS, WRIGHTSON                ACCOUNT NO.:  [id number]   MEDICAL RECORD NO.:  [id number]          PATIENT TYPE:  INP   LOCATION:  1608                         FACILITY:  [facility]   PHYSICIAN:  Ollen Gross, M.D.    DATE OF BIRTH:  10/24/1940   DATE OF ADMISSION:  01/06/2008  DATE OF DISCHARGE:  01/10/2008                               DISCHARGE SUMMARY   ADMISSION DIAGNOSES:  1. Infection, left total knee arthroplasty  2. Hypertension.  3. Reflux disease.  4. Hyperlipidemia.  5. Past history of Helicobacter pylori.   DISCHARGE DIAGNOSES:  1. Resection arthroplasty, left knee, status post left total knee      replacement arthroplasty reimplantation.  2. Postoperative tachycardia resolved.  3. Postoperative hypotension secondary to anemia.  4. Acute postoperative  blood loss anemia.  5. Status post transfusion without sequelae.  6. Hypertension.  7. Reflux disease.  8. Hyperlipidemia.  9. Past history of Helicobacter pylori.  10.Postoperative hyponatremia, improved.   PROCEDURE:  January 06, 2008, left total knee arthroplasty  reimplantation.  The surgeon was Dr. Lequita Halt and assistant was  Avel Peace PA-C.  Anesthesia spinal with [employer].   CONSULTATIONS:  None.   BRIEF HISTORY:  [name] is a 67-year male who had left total knee  arthroplasty done over a year ago.  He has had an infection.  Initially  irrigation , debridement and  poly exchange.  Unfortunately, infection  recurred.  Underwent resection arthroplasty with placement of antibiotic  spacer about 8 weeks ago.  Now presents for total knee reimplantation.   LABORATORY DATA:  CBC:  Normal hemoglobin of 13.9, hematocrit of 41.9,  white cell count normal at 7.  Platelets 260,000.  Postop hemoglobin  dropped down to 9.  She was given 2 units of blood and brought it back  up to 10.2,  drifted back down to 9, then as low as 9.7.  Last  hemoglobin 9 and hematocrit 26.9.  PT/PTT preop 21.6 and 35  respectively.   INR 1.8.  Serial pro times followed per Coumadin  protocol.  Last PT/INR 17.7 and 1.4.  Chem panel on admission had low  albumin of 3.4, slightly elevated ALP 120.  Remaining chem panel within  normal limits.  Serial BMETs were followed which showed drop in sodium  from 137 to 132, back up to 138.  Preop UA negative.  Blood group type  B+.  Fluid culture taken at time of surgery, January 06, 2008.  Smear  showed rare wbc's, no organism. No growth on the culture.  Stat Gram  stain showed no organisms seen.  The PICC line removed, tip Serratia  marcescens pansensitive except for cephazolin.  Anaerobic culture:  No  anaerobes isolated.   HOSPITAL COURSE:  The patient was admitted to [facility],  taken to the OR, underwent above procedure without complication.  The  patient tolerated the procedure well, later transferred to the recovery  room and to the orthopedic floor.  Unfortunately developed some  tachycardia.  Stat labs were taken and the patient was transferred down  to step-down for close  monitoring anesthesia was notified since this was  shortly after the procedure.  The patient was given Metoprolol IV which  stabilized the heart rate.  The patient also did receive blood through  the night.  Doing better on morning of day #1.  Seen in step-down with  Dr. Lequita Halt.  The patient still a little tachycardic but pressure was  stable.  Monitored in step-down. At that time just started up out of  bed.  Pain was under decent control by day #2.  Output was excellent  doing much better.  Heart rate is stabilized.  Pressure was stabilized  even though hemoglobin was back down a little bit to 9. Dressing was  changed.  Incision looked good.  Hemovac drains were placed at surgery  and removed without difficulty.  Put on iron supplements.  Had some  dilutional component to the sodium which was down to 132.  Looking back  at the I's and O's, the patient was diuresing well.  Held his  lisinopril  and continued Metoprolol and parameters.  Actually got up on day day #2.  .  Doing extremely well by day #3.  Hemoglobins down a little bit  further, down to 8.7.  Pressure was a little bit soft, but he was  asymptomatic with this.  Continued on the iron supplements.  Monitored  for symptoms.  He continued to walk well with therapy without having  symptoms.  Walked about 250 feet to 300 feet.  By day #4 the patient was  doing well.  Hemoglobin was back up to 9.  Tolerating his meds and  discharged home.   DISCHARGE/PLAN:  1. Was discharged home on January 10, 2008.  2. Discharge diagnoses, please see above.   DISCHARGE MEDICATIONS:  Vicodin, Robaxin, Nu-Iron, Coumadin.   DIET:  Heart-healthy, low-cholesterol diet.   ACTIVITY:  Weightbearing as tolerated.  Total knee protocol.   HOME HEALTH:  PT home nursing.   FOLLOW UP:  Dr. Lequita Halt on Thursday, January 15, 2008 for staple  removal.   DISPOSITION:  Home.   CONDITION ON DISCHARGE:  Improved.      Alexzandrew L. Perkins, P.A.C.      Ollen Gross, M.D.  Electronically Signed    ALP/MEDQ  D:  03/08/2008  T:  03/08/2008  Job:  [id number]

## 2010-07-21 NOTE — Discharge Summary (Signed)
[name], [name]                ACCOUNT NO.:  [id number]   MEDICAL RECORD NO.:  [id number]          PATIENT TYPE:  INP   LOCATION:  1603                         FACILITY:  [facility]   PHYSICIAN:  Ollen Gross, M.D.    DATE OF BIRTH:  [birth year]   DATE OF ADMISSION:  11/26/2007  DATE OF DISCHARGE:  11/29/2007                               DISCHARGE SUMMARY   ADMITTING DIAGNOSES:  1. Infected left total knee.  2. Hypertension.  3. Chronic reflux esophagitis.  4. Hyperlipidemia.  5. Past history of Helicobacter pylori.  6. Recent history of PICC-line infection of Klebsiella pneumoniae back      in July 2009.   DISCHARGE DIAGNOSES:  1. Infected left total knee arthroplasty status post left knee      resection arthroplasty with placement antibiotic spacer.  2. Mild postop blood loss anemia did not require transfusion.  3. Mild postop hyponatremia, improved.  4. Hypertension.  5. Chronic reflux esophagitis.  6. Hyperlipidemia.  7. Past history of Helicobacter pylori.  8. Recent history of PICC-line infection of Klebsiella pneumoniae back      in July 2009.   PROCEDURE:  November 26, 2007, left knee resection arthroplasty with  placement antibiotic spacer.  Surgeon Dr. Lequita Halt.  Assistant, Avel Peace PA-C.   ANESTHESIA:  Spinal anesthesia with Duramorph added.   CONSULTATIONS:  None.   BRIEF HISTORY:  [name] is a 70 year old male with a left total knee  done approximately a year ago.  Initially did well, but developed acute  infection, been treated with open I&D and poly exchange with IV  antibiotics.  Did fine until a few weeks ago.  Started having increased  pain and swelling  Now presents for resection arthroplasty.   LABORATORY DATA:  Preop CBC showed hemoglobin of 14.4, hematocrit of  43.1, normal white count of 8.7, platelets 306, hemoglobin 12.7.  Postop  hemoglobin down further to 10.8, last H&H 10.4 and 31.0.  White count  did go up to 11.2, but came back to  a normal level of 8.7 again.  PT/PTT  preop 15.0 and 29, respectively.  INR 1.1.  Serial protimes followed.  PT/INR 29.8 and 2.6.  Chem panel on admission all within normal limits  with the exception of low albumin of 3.4.  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  Sodium did drop down from 138-133 back up to 135.  Preop UA:  Amber  color, specific gravity slightly elevated at 1.034, small bili, trace  ketones otherwise negative.  Blood group type B+.  Cultures taken at  time of surgery:  Fluid culture from the knee smear showed no organisms,  no growth.  Culture of 3 days.  Anaerobic culture smear showed no  organisms, no anaerobes isolated.   EKG November 20, 2007, normal sinus rhythm, normal EKG, unconfirmed.   HOSPITAL COURSE:  The patient was admitted to [facility],  tolerated procedure well, later transferred to the recovery room on  orthopedic floor, started on PCA and p.o. analgesic pain control  following surgery.  Started back on IV antibiotics.  He had  a rough  night with pain.  We had a PICC-line placed postoperatively for  antibiotics.  He would need IV antibiotics at home.  Started back on his  home medications.  Blood pressure medications were restarted.  He had  decent output, but that was monitored closely.  Hemoglobin was stable.  Started getting up out of bed to the chair.  By day #2, still in a lot  of pain but doing a little bit better.  Had weaned over from the PCA  which was started postop over p.o. medications.  He had an antibiotic  spacer in, so no motion to the knee.  Started getting up with therapy.  We changed the dressing, had some swelling in the medial side of the  incision, otherwise, incision looked okay.  IV antibiotics were started  postop.  We did arrange for discharge planning with home IV antibiotics.  Continued to slowly progress with therapy, allowed him to be  weightbearing as tolerated in the knee immobilizer, but no motion to the  knee due to  the spacer.  Arrangements were made for home, and he was  ready to go home by November 29, 2007.   DISCHARGE/PLAN:  1. Patient discharged home on November 29, 2007.  2. Discharge diagnoses, please see above.  3. Discharge medications:  He is on Robaxin, Vicodin, Coumadin and      vancomycin IV for PICC-line.  4. He was placed into a Bledsoe locking brace, at all times except for      shower.  May remove temporarily just for showering.  No motion of      bending to the knee, do not submerge incision under water, although      he can be weightbearing as tolerated in the brace.  5. Diet:  Low-sodium, heart-healthy, cholesterol diet.  6. Follow-up on Tuesday October 6 with Dr. Lequita Halt.  Call for      appointment time.   DISPOSITION:  Home.   CONDITION ON DISCHARGE:  Slowly improving.      Alexzandrew L. Perkins, P.A.C.      Ollen Gross, M.D.  Electronically Signed    ALP/MEDQ  D:  01/23/2008  T:  01/23/2008  Job:  [id number]   cc:   Ollen Gross, M.D.  Fax: [phone]

## 2010-07-21 NOTE — Discharge Summary (Signed)
[name], [name]                ACCOUNT NO.:  [id number]   MEDICAL RECORD NO.:  [id number]          PATIENT TYPE:  INP   LOCATION:  1609                         FACILITY:  [facility]   PHYSICIAN:  Ollen Gross, M.D.    DATE OF BIRTH:  [birth year]   DATE OF ADMISSION:  07/23/2007  DATE OF DISCHARGE:  07/25/2007                               DISCHARGE SUMMARY   ADMITTING DIAGNOSES:  1. Infected left total knee.  2. Hypertension.  3. Chronic reflux esophagitis.  4. History of dizziness.  5. Hyperlipidemia.  6. History of Helicobacter pylori.   DISCHARGE DIAGNOSES:  1. Infected left total knee, status post incision and debridement left      knee, with polyethylene exchange.  2. Hypertension.  3. Chronic reflux esophagitis.  4. History of dizziness.  5. Hyperlipidemia.  6. History of Helicobacter pylori.   PROCEDURE:  On Jul 23, 2007 incision and debridement of left total knee,  with polyethylene exchange.  Surgeon:  Dr. Lequita Halt.  Assistant:  Patrica Duel, PA-C.   CONSULTS:  None.   BRIEF HISTORY:  [name] is a 70 year old male, well known to Dr.  Lequita Halt, showing signs of infection of his total knee.  It was felt he  would best be served by undergoing open I&D and poly swap.  The patient  was subsequently admitted to the hospital.   LABORATORY DATA:  CBC on admission showed a normal white count of 8.3,  hemoglobin of 14.9, hematocrit of 44.2, platelets 294.  Follow-up CBC  showed normal white count of 8.1, hemoglobin of 13, hematocrit of 38.0.  PT/PTT admission 14.1 and 28, respectively, INR 1.1.  Chem panel on  admission:  Low albumin at 3.1.  Remaining chem panel within normal  limits.  Follow-up BMET showed a drop in sodium from 137 to 134.  Preop  UA negative.  Wound culture taken at time of surgery.  The smear showed  no WBCs, no organisms seen.  No growth on the wound culture.  Anaerobe  culture showed few WBCs, with rare gram-positive cocci in pairs, but no  anaerobes isolated.   HOSPITAL COURSE:  The patient was admitted to [facility].  Tolerated the procedure well.  Later transferred to the recovery room  and the orthopedic floor.  Started on IV antibiotics.  We did have one  smear from the anaerobic culture taken that showed some gram-positive  cocci in pairs. We are going to wait for the final culture.  He was  started on IV vancomycin for 6 weeks.  We did order a PICC line to be  placed.  He was doing fairly well on the morning of day #1 after  surgery.  Hemovac drain placed at the time of surgery.  We left it in a  little bit longer for the drainage.  He was started back on his home  medications.  He had excellent urinary output.  Started getting up out  of bed.  Arrangements were being made for home health IV antibiotics.  He did well with his therapy, walked about 125 feet, and then later  200  feet.  By day #2, he was doing well.  He was sitting up in the bed, had  his PICC line placed, tolerating his IV antibiotics, tolerating his  medications.  Hemoglobin was stable.  Once arrangements were made for  home IV antibiotics, he was discharged home later that day.   DISCHARGE PLAN:  1. The patient was discharged home on Jul 25, 2007.  2. Discharge diagnoses:  Please see above.  3. Discharge medications:  Vicodin, Robaxin, vancomycin via PICC line.   DIET:  Resume home diet.   FOLLOW UP:  He is follow up with Dr. Lequita Halt next week as instructed.  Call the office for an appointment.   ACTIVITY:  Can be weightbearing as tolerated.  We left the Hemovac  drain.  He is going to pull that out himself on Saturday morning.  We  taught the patient how to empty and recharge the Hemovac drain every 4  hours, and pull drain on Saturday.  Daily dressing changes as  instructed.  Follow up as instructed.   DISPOSITION:  Home.   CONDITION UPON DISCHARGE:  Stable.  Final cultures pending, but one  smear did show gram-positive cocci in  pairs.      Alexzandrew L. Perkins, P.A.C.      Ollen Gross, M.D.  Electronically Signed    ALP/MEDQ  D:  08/20/2007  T:  08/20/2007  Job:  [id number]

## 2010-07-24 IMAGING — US US ABDOMEN COMPLETE
1 series · 14 of 25 positions shown · non-contrast
Comparison: CT abdomen and pelvis 06/29/2009.

CLINICAL DATA: Abdominal pain.

COMPLETE ABDOMINAL ULTRASOUND

[Series 1: us abdomen complete · 0.43mm/px · 14 of 68 slices shown]
[im 1/68]
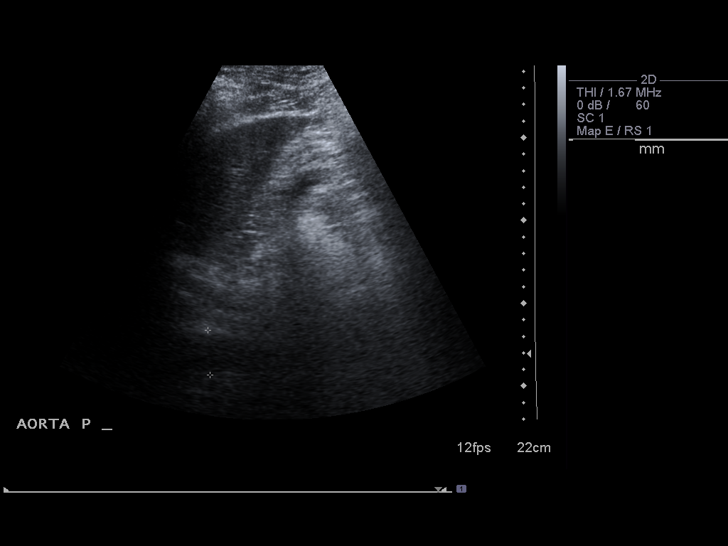
[im 6/68]
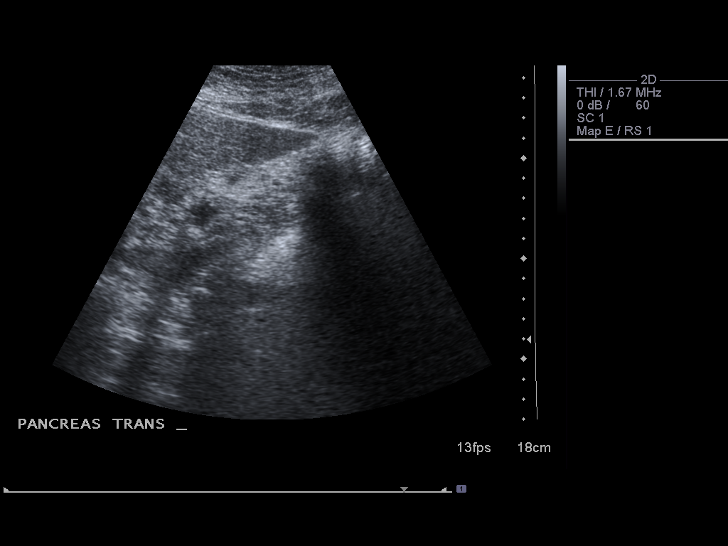
[im 12/68]
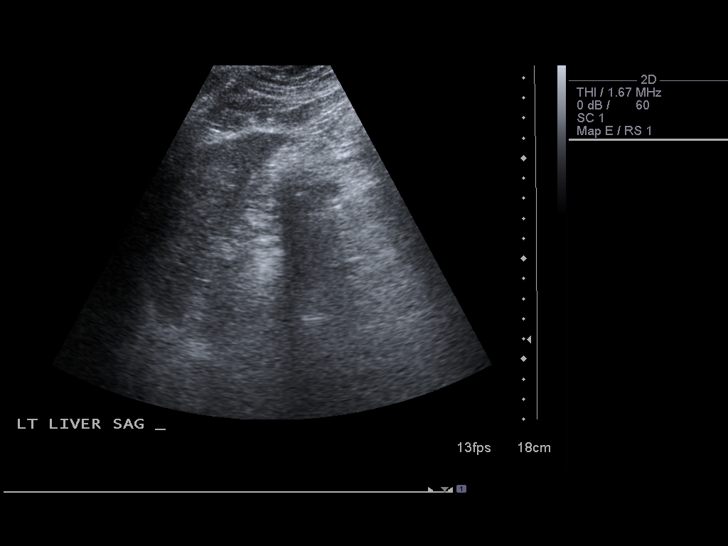
[im 17/68]
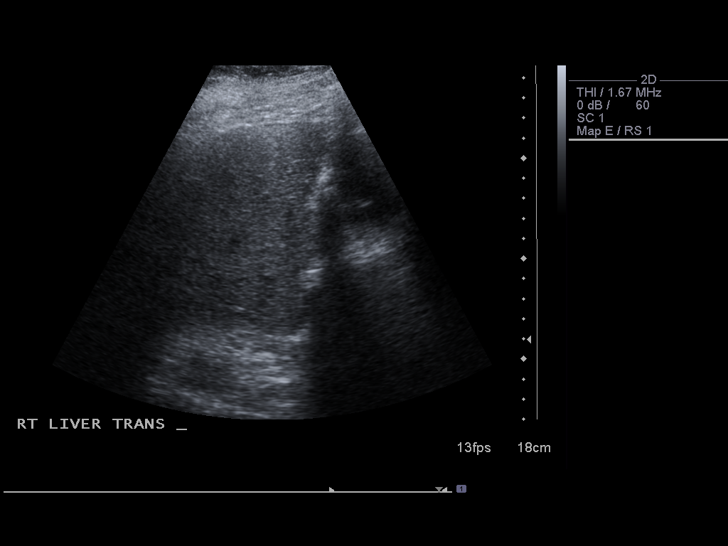
[im 23/68]
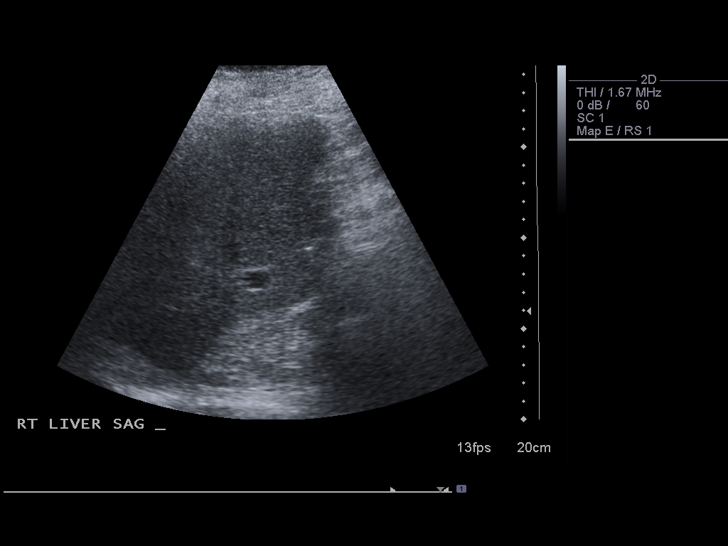
[im 26/68]
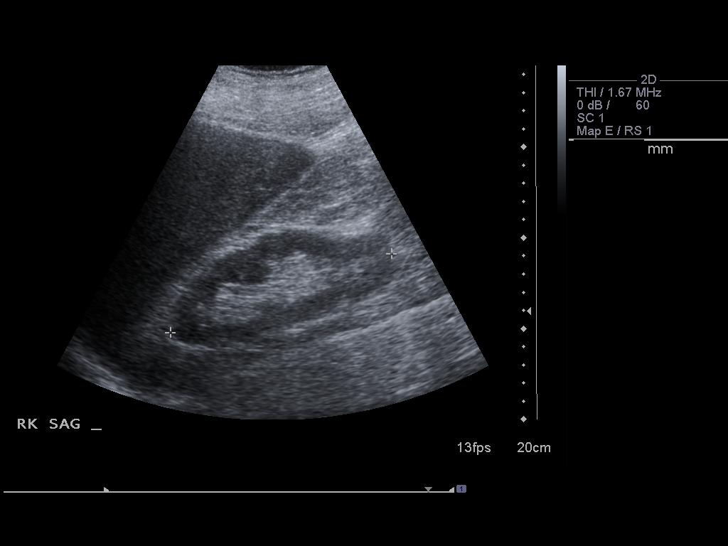
[im 31/68]
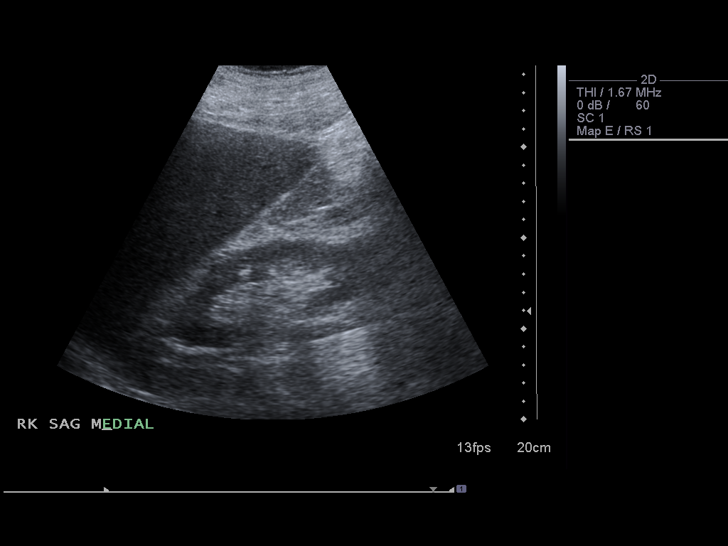
[im 37/68]
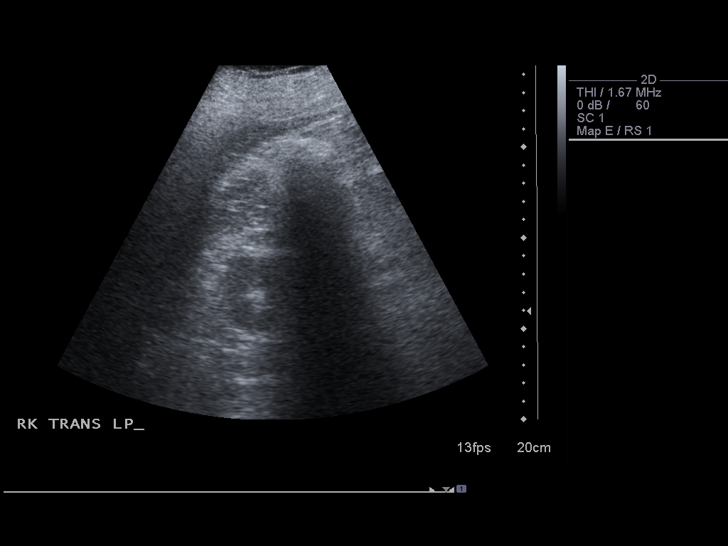
[im 42/68]
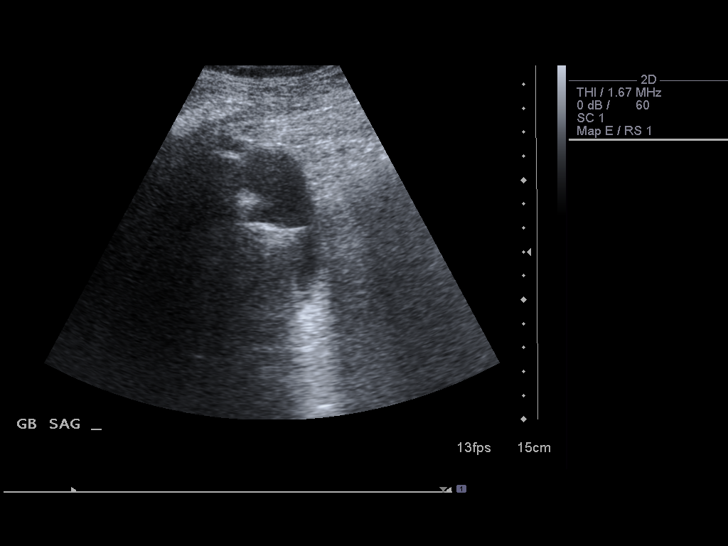
[im 45/68]
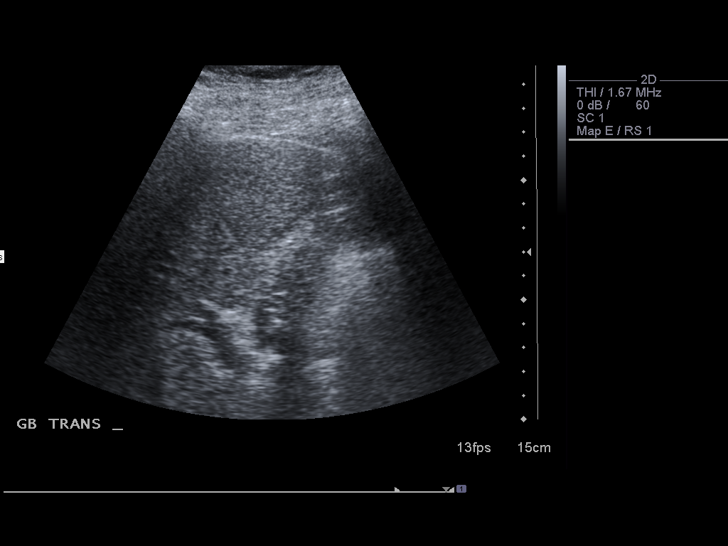
[im 51/68]
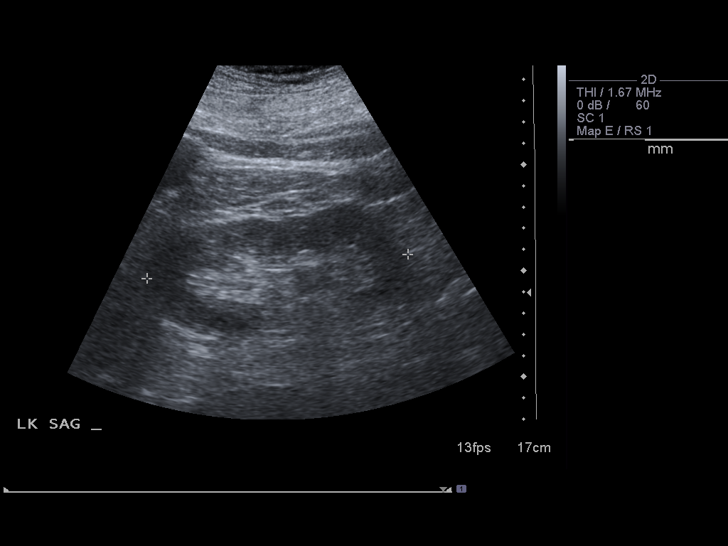
[im 56/68]
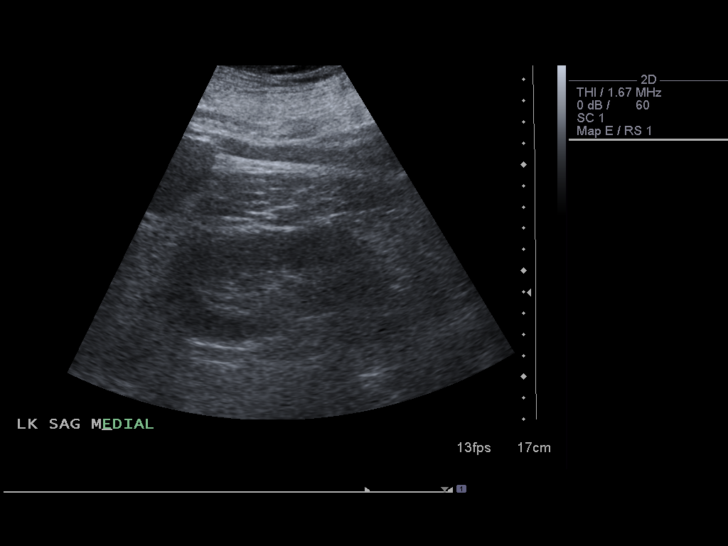
[im 62/68]
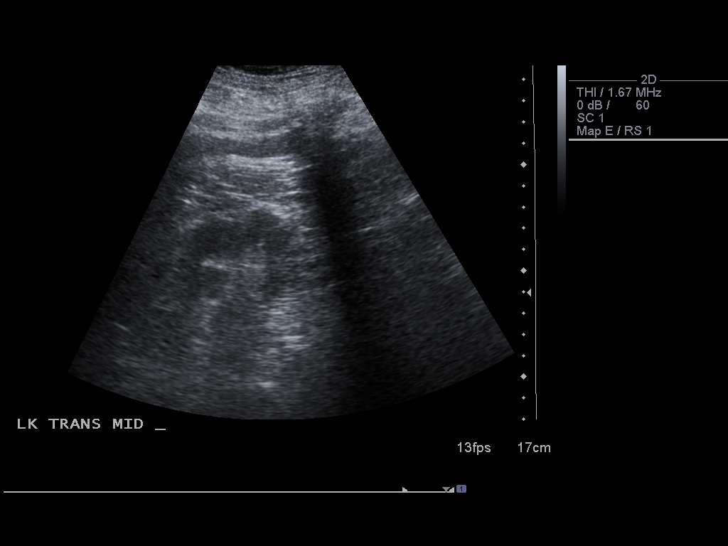
[im 68/68]
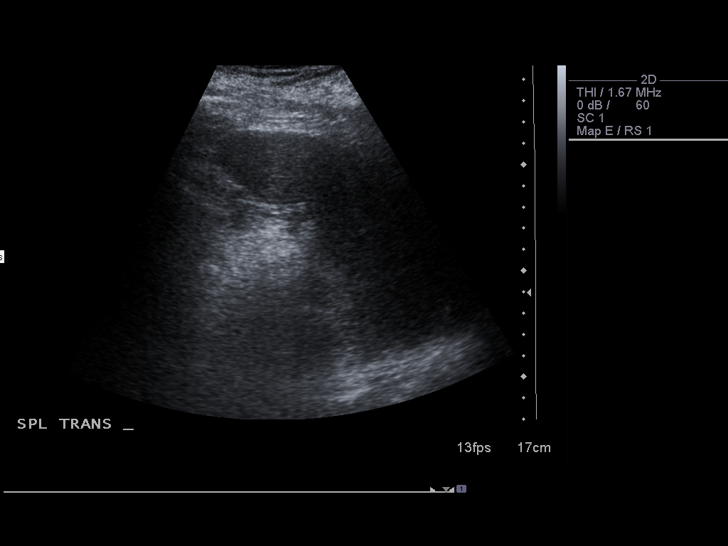

[14 of 25 positions shown; findings below may reference images not displayed]

FINDINGS: Gallbladder:  Gallstones are present.  The largest measures 2.3 cm.
Wall thickness is within normal limits at 2.1 mm.  There is no
sonographic Murphy's sign.

Common bile duct:  Measures 5 mm, within normal limits.

Liver:  There is poor definition of internal echotexture with
slight increased echogenicity, suggesting diffuse fatty
infiltration.  No focal lesions are evident.

IVC:  Appears normal.

Pancreas:  No focal abnormality seen.

Spleen:  Normal size and echotexture.  Maximal length is 9.2 cm.

Right Kidney:  Normal size and echotexture.  No focal mass lesion,
stone, or hydronephrosis is present.  The maximal length is
cm, within normal limits.

Left Kidney:  Normal size and echotexture.  No focal mass lesion,
stone, or hydronephrosis is present.  The maximal length is
cm, within normal limits.  The

Abdominal aorta:  No aneurysm identified.
IMPRESSION: 1.  Cholelithiasis without evidence for cholecystitis.  There is no
significant wall thickening or sonographic Murphy's sign.
2.  Otherwise unremarkable abdominal ultrasound.

## 2010-11-29 LAB — CBC
HCT: 38 — ABNORMAL LOW
Hemoglobin: 13
MCHC: 33.7
MCHC: 34.2
Platelets: 294
RBC: 4.37
RBC: 5.12
RDW: 13.1
WBC: 8.3

## 2010-11-29 LAB — COMPREHENSIVE METABOLIC PANEL
ALT: 14
AST: 19
Albumin: 3.1 — ABNORMAL LOW
CO2: 25
Calcium: 9.3
Creatinine, Ser: 0.75
GFR calc Af Amer: >60
GFR calc non Af Amer: >60
Sodium: 137
Total Protein: 7

## 2010-11-29 LAB — BASIC METABOLIC PANEL
CO2: 28
Calcium: 8.5
Chloride: 98
GFR calc Af Amer: >60
Glucose, Bld: 99
Potassium: 4.2
Sodium: 134 — ABNORMAL LOW

## 2010-11-29 LAB — URINALYSIS, ROUTINE W REFLEX MICROSCOPIC
Bilirubin Urine: NEGATIVE
Nitrite: NEGATIVE
Protein, ur: NEGATIVE
Specific Gravity, Urine: 1.018
Urobilinogen, UA: 1

## 2010-11-29 LAB — WOUND CULTURE: Culture: NO GROWTH

## 2010-11-29 LAB — APTT: aPTT: 28

## 2010-12-04 LAB — BASIC METABOLIC PANEL WITH GFR
BUN: 14
BUN: 9
CO2: 26
CO2: 27
Calcium: 8.2 — ABNORMAL LOW
Calcium: 8.8
Chloride: 102
Chloride: 102
Creatinine, Ser: 0.8
Creatinine, Ser: 1.01
GFR calc non Af Amer: >60
GFR calc non Af Amer: >60
Glucose, Bld: 120 — ABNORMAL HIGH
Glucose, Bld: 128 — ABNORMAL HIGH
Potassium: 4.1
Potassium: 4.6
Sodium: 133 — ABNORMAL LOW
Sodium: 135

## 2010-12-04 LAB — TYPE AND SCREEN
ABO/RH(D): B POS
ABO/RH(D): B POS
Antibody Screen: NEGATIVE
Antibody Screen: NEGATIVE

## 2010-12-04 LAB — BODY FLUID CULTURE: Culture: NO GROWTH

## 2010-12-04 LAB — COMPREHENSIVE METABOLIC PANEL WITH GFR
ALT: 22
AST: 24
Albumin: 3.4 — ABNORMAL LOW
Alkaline Phosphatase: 120 — ABNORMAL HIGH
BUN: 14
CO2: 25
Calcium: 9.2
Chloride: 103
Creatinine, Ser: 0.92
GFR calc non Af Amer: >60
Glucose, Bld: 102 — ABNORMAL HIGH
Potassium: 4.2
Sodium: 137
Total Bilirubin: 0.9
Total Protein: 6.7

## 2010-12-04 LAB — URINALYSIS, ROUTINE W REFLEX MICROSCOPIC
Bilirubin Urine: NEGATIVE
Glucose, UA: NEGATIVE
Hgb urine dipstick: NEGATIVE
Ketones, ur: NEGATIVE
Nitrite: NEGATIVE
Nitrite: NEGATIVE
Protein, ur: NEGATIVE
Protein, ur: NEGATIVE
Specific Gravity, Urine: 1.02
Urobilinogen, UA: 0.2
pH: 5

## 2010-12-04 LAB — COMPREHENSIVE METABOLIC PANEL
AST: 22
CO2: 27
Calcium: 9.3
Creatinine, Ser: 1
GFR calc Af Amer: >60
GFR calc non Af Amer: >60
Glucose, Bld: 102 — ABNORMAL HIGH
Sodium: 138
Total Protein: 7

## 2010-12-04 LAB — BASIC METABOLIC PANEL
BUN: 10
Calcium: 8.3 — ABNORMAL LOW
GFR calc non Af Amer: >60
Potassium: 4.2
Sodium: 135

## 2010-12-04 LAB — CBC
HCT: 31 — ABNORMAL LOW
HCT: 37.8 — ABNORMAL LOW
HCT: 41.9
Hemoglobin: 10.8 — ABNORMAL LOW
Hemoglobin: 12.7 — ABNORMAL LOW
Hemoglobin: 13.9
MCHC: 33.4
MCHC: 33.7
MCHC: 33.2
MCV: 86.3
MCV: 86.8
MCV: 87.5
Platelets: 291
Platelets: 330
Platelets: 262
RBC: 3.73 — ABNORMAL LOW
RBC: 4.37
RBC: 4.96
RBC: 4.79
RDW: 13.3
RDW: 14.3
RDW: 14.6
RDW: 14.7
WBC: 11.2 — ABNORMAL HIGH
WBC: 8.7
WBC: 7.8

## 2010-12-04 LAB — APTT
aPTT: 29
aPTT: 35

## 2010-12-04 LAB — PROTIME-INR
INR: 1.9 — ABNORMAL HIGH
INR: 2.6 — ABNORMAL HIGH
INR: 1.8 — ABNORMAL HIGH
Prothrombin Time: 15
Prothrombin Time: 15.8 — ABNORMAL HIGH
Prothrombin Time: 22.4 — ABNORMAL HIGH
Prothrombin Time: 21.6 — ABNORMAL HIGH

## 2010-12-04 LAB — ANAEROBIC CULTURE

## 2010-12-04 LAB — VANCOMYCIN, TROUGH: Vancomycin Tr: 10.9

## 2010-12-05 LAB — DIFFERENTIAL
Basophils Absolute: 0
Eosinophils Relative: 0
Lymphocytes Relative: 4 — ABNORMAL LOW
Monocytes Absolute: 0.7
Monocytes Relative: 6

## 2010-12-05 LAB — CBC
HCT: 26.5 — ABNORMAL LOW
HCT: 27.4 — ABNORMAL LOW
HCT: 30.5 — ABNORMAL LOW
Hemoglobin: 10.2 — ABNORMAL LOW
Hemoglobin: 8.7 — ABNORMAL LOW
Hemoglobin: 9 — ABNORMAL LOW
Hemoglobin: 9 — ABNORMAL LOW
MCHC: 32.7
MCHC: 33.5
MCHC: 34.1
MCV: 86.1
MCV: 88.4
Platelets: 156
Platelets: 179
Platelets: 229
RBC: 2.87 — ABNORMAL LOW
RBC: 2.99 — ABNORMAL LOW
RBC: 3.18 — ABNORMAL LOW
RDW: 14.5
RDW: 14.6
WBC: 11.6 — ABNORMAL HIGH
WBC: 7.6

## 2010-12-05 LAB — BASIC METABOLIC PANEL
BUN: 15
BUN: 20
BUN: 9
CO2: 25
CO2: 28
CO2: 28
Calcium: 8 — ABNORMAL LOW
Calcium: 8.3 — ABNORMAL LOW
Chloride: 101
Chloride: 104
Chloride: 105
Creatinine, Ser: 0.87
GFR calc Af Amer: >60
GFR calc non Af Amer: >60
GFR calc non Af Amer: >60
GFR calc non Af Amer: >60
Glucose, Bld: 130 — ABNORMAL HIGH
Glucose, Bld: 135 — ABNORMAL HIGH
Glucose, Bld: 98
Potassium: 3.9
Sodium: 132 — ABNORMAL LOW
Sodium: 136
Sodium: 138

## 2010-12-05 LAB — PROTIME-INR
INR: 1.1
INR: 1.3
INR: 1.4
Prothrombin Time: 14.4
Prothrombin Time: 17.7 — ABNORMAL HIGH

## 2010-12-05 LAB — BODY FLUID CULTURE

## 2010-12-05 LAB — ANAEROBIC CULTURE

## 2010-12-05 LAB — GRAM STAIN

## 2010-12-05 LAB — CATH TIP CULTURE: Culture: 15

## 2010-12-14 LAB — COMPREHENSIVE METABOLIC PANEL
AST: 21
Albumin: 3.3 — ABNORMAL LOW
Alkaline Phosphatase: 90
Chloride: 97
Creatinine, Ser: 0.79
GFR calc Af Amer: >60
Potassium: 3.9
Total Bilirubin: 0.5
Total Protein: 7.1

## 2010-12-14 LAB — DIFFERENTIAL
Basophils Absolute: 0
Eosinophils Relative: 1
Lymphocytes Relative: 15
Monocytes Absolute: 0.8 — ABNORMAL HIGH
Monocytes Relative: 8

## 2010-12-14 LAB — CBC
Platelets: 332
RDW: 12.9
WBC: 9.9

## 2010-12-14 LAB — POCT URINALYSIS DIP (DEVICE)
Operator id: 235561
Protein, ur: NEGATIVE
Urobilinogen, UA: 1

## 2010-12-18 LAB — COMPREHENSIVE METABOLIC PANEL
ALT: 35
BUN: 20
CO2: 27
Calcium: 9.4
Creatinine, Ser: 1.02
GFR calc non Af Amer: >60
Glucose, Bld: 128 — ABNORMAL HIGH
Sodium: 142
Total Protein: 7.2

## 2010-12-18 LAB — BASIC METABOLIC PANEL
BUN: 12
BUN: 12
BUN: 18
CO2: 27
CO2: 32
Calcium: 8.8
Chloride: 100
Creatinine, Ser: 0.94
Creatinine, Ser: 1.01
GFR calc Af Amer: >60
GFR calc Af Amer: >60
GFR calc non Af Amer: >60
Glucose, Bld: 109 — ABNORMAL HIGH
Glucose, Bld: 145 — ABNORMAL HIGH
Potassium: 4.9

## 2010-12-18 LAB — PROTIME-INR
INR: 1
INR: 1.6 — ABNORMAL HIGH
Prothrombin Time: 13.3
Prothrombin Time: 13.7
Prothrombin Time: 19.4 — ABNORMAL HIGH

## 2010-12-18 LAB — TYPE AND SCREEN
ABO/RH(D): B POS
Antibody Screen: NEGATIVE

## 2010-12-18 LAB — URINALYSIS, ROUTINE W REFLEX MICROSCOPIC
Glucose, UA: NEGATIVE
Hgb urine dipstick: NEGATIVE
Ketones, ur: NEGATIVE
Protein, ur: NEGATIVE
Urobilinogen, UA: 1

## 2010-12-18 LAB — CBC
HCT: 40.3
Hemoglobin: 17.3 — ABNORMAL HIGH
MCHC: 34.8
MCHC: 35.3
MCHC: 35.3
MCHC: 35.4
MCV: 89.9
MCV: 90.3
MCV: 90.7
Platelets: 169
Platelets: 179
RBC: 4.7
RBC: 5.47
RDW: 13.3
RDW: 13.4
RDW: 13.4

## 2010-12-18 LAB — APTT: aPTT: 27

## 2013-10-29 ENCOUNTER — Encounter: Payer: Self-pay | Admitting: Gastroenterology

## 2015-04-14 DIAGNOSIS — L02212 Cutaneous abscess of back [any part, except buttock]: Secondary | ICD-10-CM | POA: Diagnosis not present

## 2015-04-14 DIAGNOSIS — L720 Epidermal cyst: Secondary | ICD-10-CM | POA: Diagnosis not present

## 2015-04-14 DIAGNOSIS — L821 Other seborrheic keratosis: Secondary | ICD-10-CM | POA: Diagnosis not present

## 2015-04-14 DIAGNOSIS — L814 Other melanin hyperpigmentation: Secondary | ICD-10-CM | POA: Diagnosis not present

## 2015-05-16 DIAGNOSIS — E7800 Pure hypercholesterolemia, unspecified: Secondary | ICD-10-CM | POA: Diagnosis not present

## 2015-05-16 DIAGNOSIS — Z125 Encounter for screening for malignant neoplasm of prostate: Secondary | ICD-10-CM | POA: Diagnosis not present

## 2015-05-16 DIAGNOSIS — K210 Gastro-esophageal reflux disease with esophagitis: Secondary | ICD-10-CM | POA: Diagnosis not present

## 2015-05-16 DIAGNOSIS — G2581 Restless legs syndrome: Secondary | ICD-10-CM | POA: Diagnosis not present

## 2015-05-16 DIAGNOSIS — E559 Vitamin D deficiency, unspecified: Secondary | ICD-10-CM | POA: Diagnosis not present

## 2015-05-16 DIAGNOSIS — Z79899 Other long term (current) drug therapy: Secondary | ICD-10-CM | POA: Diagnosis not present

## 2015-05-16 DIAGNOSIS — Z1212 Encounter for screening for malignant neoplasm of rectum: Secondary | ICD-10-CM | POA: Diagnosis not present

## 2015-05-16 DIAGNOSIS — I10 Essential (primary) hypertension: Secondary | ICD-10-CM | POA: Diagnosis not present

## 2015-05-16 DIAGNOSIS — Z0000 Encounter for general adult medical examination without abnormal findings: Secondary | ICD-10-CM | POA: Diagnosis not present

## 2015-05-23 DIAGNOSIS — G4733 Obstructive sleep apnea (adult) (pediatric): Secondary | ICD-10-CM | POA: Diagnosis not present

## 2015-05-23 DIAGNOSIS — E559 Vitamin D deficiency, unspecified: Secondary | ICD-10-CM | POA: Diagnosis not present

## 2015-05-23 DIAGNOSIS — R972 Elevated prostate specific antigen [PSA]: Secondary | ICD-10-CM | POA: Diagnosis not present

## 2015-05-23 DIAGNOSIS — E785 Hyperlipidemia, unspecified: Secondary | ICD-10-CM | POA: Diagnosis not present

## 2015-05-23 DIAGNOSIS — I10 Essential (primary) hypertension: Secondary | ICD-10-CM | POA: Diagnosis not present

## 2015-06-02 DIAGNOSIS — N401 Enlarged prostate with lower urinary tract symptoms: Secondary | ICD-10-CM | POA: Diagnosis not present

## 2015-06-02 DIAGNOSIS — R972 Elevated prostate specific antigen [PSA]: Secondary | ICD-10-CM | POA: Diagnosis not present

## 2015-06-24 DIAGNOSIS — R972 Elevated prostate specific antigen [PSA]: Secondary | ICD-10-CM | POA: Diagnosis not present

## 2015-06-24 DIAGNOSIS — N401 Enlarged prostate with lower urinary tract symptoms: Secondary | ICD-10-CM | POA: Diagnosis not present

## 2015-07-11 DIAGNOSIS — N401 Enlarged prostate with lower urinary tract symptoms: Secondary | ICD-10-CM | POA: Diagnosis not present

## 2015-07-11 DIAGNOSIS — R972 Elevated prostate specific antigen [PSA]: Secondary | ICD-10-CM | POA: Diagnosis not present

## 2015-07-20 DIAGNOSIS — N401 Enlarged prostate with lower urinary tract symptoms: Secondary | ICD-10-CM | POA: Diagnosis not present

## 2015-07-20 DIAGNOSIS — C61 Malignant neoplasm of prostate: Secondary | ICD-10-CM | POA: Diagnosis not present

## 2015-07-20 DIAGNOSIS — R972 Elevated prostate specific antigen [PSA]: Secondary | ICD-10-CM | POA: Diagnosis not present

## 2015-07-27 DIAGNOSIS — C61 Malignant neoplasm of prostate: Secondary | ICD-10-CM | POA: Diagnosis not present

## 2015-07-27 DIAGNOSIS — R972 Elevated prostate specific antigen [PSA]: Secondary | ICD-10-CM | POA: Diagnosis not present

## 2015-08-02 DIAGNOSIS — C61 Malignant neoplasm of prostate: Secondary | ICD-10-CM | POA: Diagnosis not present

## 2015-08-09 DIAGNOSIS — C61 Malignant neoplasm of prostate: Secondary | ICD-10-CM | POA: Diagnosis not present

## 2015-08-16 DIAGNOSIS — C61 Malignant neoplasm of prostate: Secondary | ICD-10-CM | POA: Diagnosis not present

## 2015-08-23 DIAGNOSIS — G4700 Insomnia, unspecified: Secondary | ICD-10-CM | POA: Diagnosis not present

## 2015-08-23 DIAGNOSIS — E7800 Pure hypercholesterolemia, unspecified: Secondary | ICD-10-CM | POA: Diagnosis not present

## 2015-08-23 DIAGNOSIS — N401 Enlarged prostate with lower urinary tract symptoms: Secondary | ICD-10-CM | POA: Diagnosis not present

## 2015-08-23 DIAGNOSIS — E559 Vitamin D deficiency, unspecified: Secondary | ICD-10-CM | POA: Diagnosis not present

## 2015-08-23 DIAGNOSIS — G4733 Obstructive sleep apnea (adult) (pediatric): Secondary | ICD-10-CM | POA: Diagnosis not present

## 2015-08-23 DIAGNOSIS — I10 Essential (primary) hypertension: Secondary | ICD-10-CM | POA: Diagnosis not present

## 2015-09-08 DIAGNOSIS — Z87891 Personal history of nicotine dependence: Secondary | ICD-10-CM | POA: Diagnosis not present

## 2015-09-08 DIAGNOSIS — Z8042 Family history of malignant neoplasm of prostate: Secondary | ICD-10-CM | POA: Diagnosis not present

## 2015-09-08 DIAGNOSIS — Z7982 Long term (current) use of aspirin: Secondary | ICD-10-CM | POA: Diagnosis not present

## 2015-09-08 DIAGNOSIS — C61 Malignant neoplasm of prostate: Secondary | ICD-10-CM | POA: Diagnosis not present

## 2015-09-08 DIAGNOSIS — K219 Gastro-esophageal reflux disease without esophagitis: Secondary | ICD-10-CM | POA: Insufficient documentation

## 2015-09-08 DIAGNOSIS — E785 Hyperlipidemia, unspecified: Secondary | ICD-10-CM | POA: Diagnosis not present

## 2015-09-08 DIAGNOSIS — Z79899 Other long term (current) drug therapy: Secondary | ICD-10-CM | POA: Diagnosis not present

## 2015-09-08 DIAGNOSIS — E66813 Obesity, class 3: Secondary | ICD-10-CM | POA: Insufficient documentation

## 2015-09-08 DIAGNOSIS — I10 Essential (primary) hypertension: Secondary | ICD-10-CM | POA: Diagnosis not present

## 2015-09-15 ENCOUNTER — Encounter (HOSPITAL_COMMUNITY): Payer: Self-pay | Admitting: Emergency Medicine

## 2015-09-15 ENCOUNTER — Ambulatory Visit (HOSPITAL_COMMUNITY)
Admission: EM | Admit: 2015-09-15 | Discharge: 2015-09-15 | Disposition: A | Discharge status: Nursing Home (Includes ICF) | Payer: Medicare Other | Attending: Family Medicine | Admitting: Family Medicine

## 2015-09-15 ENCOUNTER — Ambulatory Visit (INDEPENDENT_AMBULATORY_CARE_PROVIDER_SITE_OTHER): Payer: Medicare Other

## 2015-09-15 ENCOUNTER — Emergency Department (HOSPITAL_COMMUNITY)
Admission: EM | Admit: 2015-09-15 | Discharge: 2015-09-15 | Disposition: A | Discharge status: Home / Self Care | Payer: Medicare Other | Attending: Dermatology | Admitting: Dermatology

## 2015-09-15 DIAGNOSIS — R938 Abnormal findings on diagnostic imaging of other specified body structures: Secondary | ICD-10-CM

## 2015-09-15 DIAGNOSIS — Z9660 Presence of unspecified orthopedic joint implant: Secondary | ICD-10-CM | POA: Insufficient documentation

## 2015-09-15 DIAGNOSIS — R0981 Nasal congestion: Secondary | ICD-10-CM | POA: Insufficient documentation

## 2015-09-15 DIAGNOSIS — Z5321 Procedure and treatment not carried out due to patient leaving prior to being seen by health care provider: Secondary | ICD-10-CM | POA: Insufficient documentation

## 2015-09-15 DIAGNOSIS — R9389 Abnormal findings on diagnostic imaging of other specified body structures: Secondary | ICD-10-CM

## 2015-09-15 DIAGNOSIS — E119 Type 2 diabetes mellitus without complications: Secondary | ICD-10-CM | POA: Diagnosis not present

## 2015-09-15 DIAGNOSIS — R05 Cough: Secondary | ICD-10-CM | POA: Diagnosis not present

## 2015-09-15 DIAGNOSIS — Z87891 Personal history of nicotine dependence: Secondary | ICD-10-CM | POA: Insufficient documentation

## 2015-09-15 NOTE — ED Notes (Signed)
PT reports sore throat and productive cough for 5 days. PT reports chest and ribs are sore when he coughs. PT reports he is coughing up green phlegm.

## 2015-09-15 NOTE — ED Notes (Signed)
Patient came up to the desk angry about the wait. Stated the RN before said it was going to be an hour. Pt stated, "I cam down here from [facility] to get [facility] and they said it would be an hour. My wife is a diabetic and she needs something to eat." Pt reassured and stated that I would call the MD to see if I could get the CT ordered. Verbalized understanding and stated he would wait fifteen minutes. Offered food for his wife.

## 2015-09-15 NOTE — ED Notes (Signed)
Patient sent from urgent care for cough x 5 days.   Patient has consolidation for L lower lobe concern for neoplasm.

## 2015-09-15 NOTE — ED Notes (Signed)
Patient made aware of the plan of care. Pt reports, "I will just go to [facility]. They lied to me at [facility] and said it would take me one hour. They said I would be in [place]." Apologized to patient for the misunderstanding. Encouraged him to stay. Decided to leave.

## 2015-09-15 NOTE — ED Notes (Addendum)
MD Jeneen Rinks called about patient's situation. Stated he would start lab work and then order CT

## 2015-09-15 NOTE — ED Notes (Signed)
Report given to nurse first by this RN

## 2015-09-15 NOTE — ED Notes (Signed)
Pt went to urgent care this morning for cough and congestion. CXR showed Focal ill-defined opacity in retrocardiac left lower lobe and recommended a CT of chest. Pt denies fever.

## 2015-09-15 NOTE — ED Provider Notes (Signed)
CSN: [id number]     Arrival date & time 09/15/15  1221 History   First MD Initiated Contact with Patient 09/15/15 1302     Chief Complaint  Patient presents with  . Cough   (Consider location/radiation/quality/duration/timing/severity/associated sxs/prior Treatment) Patient is a 75 y.o. male presenting with cough. The history is provided by the patient.  Cough Cough characteristics:  Productive Sputum characteristics:  Green Severity:  Moderate Onset quality:  Gradual Duration:  5 days Smoker: no   Context: upper respiratory infection and weather changes   Context: not sick contacts   Ineffective treatments:  Cough suppressants Associated symptoms: chest pain, rhinorrhea, sinus congestion and sore throat   Associated symptoms: no fever, no rash and no shortness of breath     Past Medical History  Diagnosis Date  . Diabetes mellitus without complication [facility])    Past Surgical History  Procedure Laterality Date  . Joint replacement     No family history on file. Social History  Substance Use Topics  . Smoking status: Former Smoker    Quit date: 09/02/1985  . Smokeless tobacco: None  . Alcohol Use: No    Review of Systems  Constitutional: Negative.  Negative for fever.  HENT: Positive for congestion, postnasal drip, rhinorrhea and sore throat.   Respiratory: Positive for cough. Negative for shortness of breath.   Cardiovascular: Positive for chest pain.  Genitourinary: Negative.   Skin: Negative for rash.  All other systems reviewed and are negative.   Allergies  Percocet  Home Medications   Prior to Admission medications   Not on File   Meds Ordered and Administered this Visit  Medications - No data to display  BP 107/69 mmHg  Pulse 71  Temp(Src) 98.3 F (36.8 C) (Oral)  Resp 16  SpO2 95% No data found.   Physical Exam  Constitutional: He is oriented to person, place, and time. He appears well-developed and well-nourished. No distress.  HENT:   Right Ear: External ear normal.  Left Ear: External ear normal.  Mouth/Throat: Oropharynx is clear and moist.  Neck: Normal range of motion. Neck supple.  Cardiovascular: Normal rate, regular rhythm, normal heart sounds and intact distal pulses.   Pulmonary/Chest: Effort normal and breath sounds normal.  Lymphadenopathy:    He has no cervical adenopathy.  Neurological: He is alert and oriented to person, place, and time.  Skin: Skin is warm and dry.  Nursing note and vitals reviewed.   ED Course  Procedures (including critical care time)  Labs Review Labs Reviewed - No data to display  Imaging Review Dg Chest 2 View  09/15/2015  CLINICAL DATA:  Productive cough.  Low-grade fever. EXAM: CHEST  2 VIEW COMPARISON:  09/11/2009 FINDINGS: Mild cardiomegaly and ectasia thoracic aorta are stable. Ill-defined focal opacity seen in the retrocardiac left lower lobe is seen which may be due to pneumonia or possibly neoplasm. No evidence of pleural effusion. IMPRESSION: Focal ill-defined opacity in retrocardiac left lower lobe, which may be due to pneumonia or possibly neoplasm. Recommend continued chest x-ray follow-up in several weeks to confirm resolution, or chest CT without contrast for further evaluation. Electronically Signed   By: Earle Gell M.D.   On: 09/15/2015 13:48   X-rays reviewed and report per radiologist.   Visual Acuity Review  Right Eye Distance:   Left Eye Distance:   Bilateral Distance:    Right Eye Near:   Left Eye Near:    Bilateral Near:  MDM   1. Abnormal chest x-ray    Sent for ct chest per xray eval.h/o recent prostate ca eval.   Billy Fischer, MD 09/15/15 [phone]

## 2015-09-16 DIAGNOSIS — J181 Lobar pneumonia, unspecified organism: Secondary | ICD-10-CM | POA: Diagnosis not present

## 2015-09-16 DIAGNOSIS — R938 Abnormal findings on diagnostic imaging of other specified body structures: Secondary | ICD-10-CM | POA: Diagnosis not present

## 2015-09-16 DIAGNOSIS — I77810 Thoracic aortic ectasia: Secondary | ICD-10-CM | POA: Diagnosis not present

## 2015-09-19 DIAGNOSIS — Z8042 Family history of malignant neoplasm of prostate: Secondary | ICD-10-CM | POA: Insufficient documentation

## 2015-09-20 DIAGNOSIS — C61 Malignant neoplasm of prostate: Secondary | ICD-10-CM | POA: Diagnosis not present

## 2015-09-20 DIAGNOSIS — J181 Lobar pneumonia, unspecified organism: Secondary | ICD-10-CM | POA: Diagnosis not present

## 2015-09-20 DIAGNOSIS — R938 Abnormal findings on diagnostic imaging of other specified body structures: Secondary | ICD-10-CM | POA: Diagnosis not present

## 2015-09-20 DIAGNOSIS — R918 Other nonspecific abnormal finding of lung field: Secondary | ICD-10-CM | POA: Diagnosis not present

## 2015-09-23 DIAGNOSIS — Z7982 Long term (current) use of aspirin: Secondary | ICD-10-CM | POA: Diagnosis not present

## 2015-09-23 DIAGNOSIS — Z8042 Family history of malignant neoplasm of prostate: Secondary | ICD-10-CM | POA: Diagnosis not present

## 2015-09-23 DIAGNOSIS — G2581 Restless legs syndrome: Secondary | ICD-10-CM | POA: Diagnosis not present

## 2015-09-23 DIAGNOSIS — Z01810 Encounter for preprocedural cardiovascular examination: Secondary | ICD-10-CM | POA: Diagnosis not present

## 2015-09-23 DIAGNOSIS — Z96652 Presence of left artificial knee joint: Secondary | ICD-10-CM | POA: Diagnosis not present

## 2015-09-23 DIAGNOSIS — R9431 Abnormal electrocardiogram [ECG] [EKG]: Secondary | ICD-10-CM | POA: Diagnosis not present

## 2015-09-23 DIAGNOSIS — I493 Ventricular premature depolarization: Secondary | ICD-10-CM | POA: Diagnosis not present

## 2015-09-23 DIAGNOSIS — Z01811 Encounter for preprocedural respiratory examination: Secondary | ICD-10-CM | POA: Diagnosis not present

## 2015-09-23 DIAGNOSIS — K219 Gastro-esophageal reflux disease without esophagitis: Secondary | ICD-10-CM | POA: Diagnosis not present

## 2015-09-23 DIAGNOSIS — Z6841 Body Mass Index (BMI) 40.0 and over, adult: Secondary | ICD-10-CM | POA: Diagnosis not present

## 2015-09-23 DIAGNOSIS — Z79899 Other long term (current) drug therapy: Secondary | ICD-10-CM | POA: Diagnosis not present

## 2015-09-23 DIAGNOSIS — Z87891 Personal history of nicotine dependence: Secondary | ICD-10-CM | POA: Diagnosis not present

## 2015-09-23 DIAGNOSIS — C61 Malignant neoplasm of prostate: Secondary | ICD-10-CM | POA: Diagnosis not present

## 2015-09-23 DIAGNOSIS — Z01818 Encounter for other preprocedural examination: Secondary | ICD-10-CM | POA: Diagnosis not present

## 2015-09-23 DIAGNOSIS — E785 Hyperlipidemia, unspecified: Secondary | ICD-10-CM | POA: Diagnosis not present

## 2015-09-23 DIAGNOSIS — Z9049 Acquired absence of other specified parts of digestive tract: Secondary | ICD-10-CM | POA: Diagnosis not present

## 2015-09-23 DIAGNOSIS — G4733 Obstructive sleep apnea (adult) (pediatric): Secondary | ICD-10-CM | POA: Diagnosis not present

## 2015-09-23 DIAGNOSIS — I10 Essential (primary) hypertension: Secondary | ICD-10-CM | POA: Diagnosis not present

## 2015-09-23 DIAGNOSIS — N400 Enlarged prostate without lower urinary tract symptoms: Secondary | ICD-10-CM | POA: Diagnosis not present

## 2015-09-23 DIAGNOSIS — G4700 Insomnia, unspecified: Secondary | ICD-10-CM | POA: Diagnosis not present

## 2015-10-11 DIAGNOSIS — J181 Lobar pneumonia, unspecified organism: Secondary | ICD-10-CM | POA: Diagnosis not present

## 2015-10-21 DIAGNOSIS — Z79899 Other long term (current) drug therapy: Secondary | ICD-10-CM | POA: Diagnosis not present

## 2015-10-21 DIAGNOSIS — I10 Essential (primary) hypertension: Secondary | ICD-10-CM | POA: Diagnosis present

## 2015-10-21 DIAGNOSIS — G2581 Restless legs syndrome: Secondary | ICD-10-CM | POA: Insufficient documentation

## 2015-10-21 DIAGNOSIS — C61 Malignant neoplasm of prostate: Secondary | ICD-10-CM | POA: Diagnosis not present

## 2015-10-21 DIAGNOSIS — K219 Gastro-esophageal reflux disease without esophagitis: Secondary | ICD-10-CM | POA: Diagnosis present

## 2015-10-21 DIAGNOSIS — G4733 Obstructive sleep apnea (adult) (pediatric): Secondary | ICD-10-CM | POA: Diagnosis present

## 2015-10-21 DIAGNOSIS — Z888 Allergy status to other drugs, medicaments and biological substances status: Secondary | ICD-10-CM | POA: Diagnosis not present

## 2015-10-21 DIAGNOSIS — Z6841 Body Mass Index (BMI) 40.0 and over, adult: Secondary | ICD-10-CM | POA: Diagnosis not present

## 2015-10-21 DIAGNOSIS — Z7982 Long term (current) use of aspirin: Secondary | ICD-10-CM | POA: Diagnosis not present

## 2015-10-21 DIAGNOSIS — E785 Hyperlipidemia, unspecified: Secondary | ICD-10-CM | POA: Diagnosis present

## 2015-11-01 DIAGNOSIS — C61 Malignant neoplasm of prostate: Secondary | ICD-10-CM | POA: Diagnosis not present

## 2015-11-29 DIAGNOSIS — I10 Essential (primary) hypertension: Secondary | ICD-10-CM | POA: Diagnosis not present

## 2015-11-29 DIAGNOSIS — Z23 Encounter for immunization: Secondary | ICD-10-CM | POA: Diagnosis not present

## 2015-11-29 DIAGNOSIS — Z0000 Encounter for general adult medical examination without abnormal findings: Secondary | ICD-10-CM | POA: Diagnosis not present

## 2015-11-29 DIAGNOSIS — E559 Vitamin D deficiency, unspecified: Secondary | ICD-10-CM | POA: Diagnosis not present

## 2015-11-29 DIAGNOSIS — E785 Hyperlipidemia, unspecified: Secondary | ICD-10-CM | POA: Diagnosis not present

## 2015-11-29 DIAGNOSIS — K219 Gastro-esophageal reflux disease without esophagitis: Secondary | ICD-10-CM | POA: Diagnosis not present

## 2015-11-29 DIAGNOSIS — Z1211 Encounter for screening for malignant neoplasm of colon: Secondary | ICD-10-CM | POA: Diagnosis not present

## 2015-12-02 DIAGNOSIS — C61 Malignant neoplasm of prostate: Secondary | ICD-10-CM | POA: Diagnosis not present

## 2015-12-07 DIAGNOSIS — R938 Abnormal findings on diagnostic imaging of other specified body structures: Secondary | ICD-10-CM | POA: Diagnosis not present

## 2015-12-07 DIAGNOSIS — G4733 Obstructive sleep apnea (adult) (pediatric): Secondary | ICD-10-CM | POA: Diagnosis not present

## 2015-12-07 DIAGNOSIS — Z6841 Body Mass Index (BMI) 40.0 and over, adult: Secondary | ICD-10-CM | POA: Diagnosis not present

## 2015-12-07 DIAGNOSIS — E782 Mixed hyperlipidemia: Secondary | ICD-10-CM | POA: Diagnosis not present

## 2015-12-07 DIAGNOSIS — I2510 Atherosclerotic heart disease of native coronary artery without angina pectoris: Secondary | ICD-10-CM | POA: Insufficient documentation

## 2015-12-08 DIAGNOSIS — C61 Malignant neoplasm of prostate: Secondary | ICD-10-CM | POA: Diagnosis not present

## 2015-12-08 DIAGNOSIS — L820 Inflamed seborrheic keratosis: Secondary | ICD-10-CM | POA: Diagnosis not present

## 2015-12-08 DIAGNOSIS — C4442 Squamous cell carcinoma of skin of scalp and neck: Secondary | ICD-10-CM | POA: Diagnosis not present

## 2015-12-08 DIAGNOSIS — R8299 Other abnormal findings in urine: Secondary | ICD-10-CM | POA: Diagnosis not present

## 2015-12-08 DIAGNOSIS — L918 Other hypertrophic disorders of the skin: Secondary | ICD-10-CM | POA: Diagnosis not present

## 2015-12-09 DIAGNOSIS — I2510 Atherosclerotic heart disease of native coronary artery without angina pectoris: Secondary | ICD-10-CM | POA: Diagnosis not present

## 2015-12-09 DIAGNOSIS — N182 Chronic kidney disease, stage 2 (mild): Secondary | ICD-10-CM | POA: Diagnosis not present

## 2015-12-09 DIAGNOSIS — Z87898 Personal history of other specified conditions: Secondary | ICD-10-CM | POA: Diagnosis not present

## 2015-12-09 DIAGNOSIS — R938 Abnormal findings on diagnostic imaging of other specified body structures: Secondary | ICD-10-CM | POA: Diagnosis not present

## 2015-12-09 DIAGNOSIS — I129 Hypertensive chronic kidney disease with stage 1 through stage 4 chronic kidney disease, or unspecified chronic kidney disease: Secondary | ICD-10-CM | POA: Diagnosis not present

## 2015-12-09 DIAGNOSIS — G4733 Obstructive sleep apnea (adult) (pediatric): Secondary | ICD-10-CM | POA: Diagnosis not present

## 2015-12-09 DIAGNOSIS — E559 Vitamin D deficiency, unspecified: Secondary | ICD-10-CM | POA: Diagnosis not present

## 2015-12-09 DIAGNOSIS — E785 Hyperlipidemia, unspecified: Secondary | ICD-10-CM | POA: Diagnosis not present

## 2015-12-09 DIAGNOSIS — Z79899 Other long term (current) drug therapy: Secondary | ICD-10-CM | POA: Diagnosis not present

## 2015-12-09 DIAGNOSIS — E782 Mixed hyperlipidemia: Secondary | ICD-10-CM | POA: Diagnosis not present

## 2015-12-09 DIAGNOSIS — N401 Enlarged prostate with lower urinary tract symptoms: Secondary | ICD-10-CM | POA: Diagnosis not present

## 2015-12-12 DIAGNOSIS — I2510 Atherosclerotic heart disease of native coronary artery without angina pectoris: Secondary | ICD-10-CM | POA: Diagnosis not present

## 2015-12-12 DIAGNOSIS — R938 Abnormal findings on diagnostic imaging of other specified body structures: Secondary | ICD-10-CM | POA: Diagnosis not present

## 2015-12-12 DIAGNOSIS — G4733 Obstructive sleep apnea (adult) (pediatric): Secondary | ICD-10-CM | POA: Diagnosis not present

## 2015-12-13 DIAGNOSIS — R938 Abnormal findings on diagnostic imaging of other specified body structures: Secondary | ICD-10-CM | POA: Diagnosis not present

## 2015-12-13 DIAGNOSIS — G4733 Obstructive sleep apnea (adult) (pediatric): Secondary | ICD-10-CM | POA: Diagnosis not present

## 2015-12-13 DIAGNOSIS — I2510 Atherosclerotic heart disease of native coronary artery without angina pectoris: Secondary | ICD-10-CM | POA: Diagnosis not present

## 2015-12-14 DIAGNOSIS — R079 Chest pain, unspecified: Secondary | ICD-10-CM | POA: Diagnosis not present

## 2016-01-06 DIAGNOSIS — N390 Urinary tract infection, site not specified: Secondary | ICD-10-CM | POA: Diagnosis not present

## 2016-02-06 DIAGNOSIS — N390 Urinary tract infection, site not specified: Secondary | ICD-10-CM | POA: Diagnosis not present

## 2016-04-21 DIAGNOSIS — H2513 Age-related nuclear cataract, bilateral: Secondary | ICD-10-CM | POA: Diagnosis not present

## 2016-04-21 DIAGNOSIS — H35033 Hypertensive retinopathy, bilateral: Secondary | ICD-10-CM | POA: Diagnosis not present

## 2016-04-21 DIAGNOSIS — H43813 Vitreous degeneration, bilateral: Secondary | ICD-10-CM | POA: Diagnosis not present

## 2016-04-21 DIAGNOSIS — H18413 Arcus senilis, bilateral: Secondary | ICD-10-CM | POA: Diagnosis not present

## 2016-05-23 DIAGNOSIS — Z471 Aftercare following joint replacement surgery: Secondary | ICD-10-CM | POA: Diagnosis not present

## 2016-05-23 DIAGNOSIS — Z96652 Presence of left artificial knee joint: Secondary | ICD-10-CM | POA: Diagnosis not present

## 2016-05-23 DIAGNOSIS — Z4789 Encounter for other orthopedic aftercare: Secondary | ICD-10-CM | POA: Diagnosis not present

## 2016-05-28 DIAGNOSIS — M6281 Muscle weakness (generalized): Secondary | ICD-10-CM | POA: Diagnosis not present

## 2016-05-28 DIAGNOSIS — M25562 Pain in left knee: Secondary | ICD-10-CM | POA: Diagnosis not present

## 2016-05-28 DIAGNOSIS — M25662 Stiffness of left knee, not elsewhere classified: Secondary | ICD-10-CM | POA: Diagnosis not present

## 2016-05-30 DIAGNOSIS — Z96651 Presence of right artificial knee joint: Secondary | ICD-10-CM | POA: Diagnosis not present

## 2016-05-30 DIAGNOSIS — M6281 Muscle weakness (generalized): Secondary | ICD-10-CM | POA: Diagnosis not present

## 2016-05-30 DIAGNOSIS — M25562 Pain in left knee: Secondary | ICD-10-CM | POA: Diagnosis not present

## 2016-05-30 DIAGNOSIS — M25551 Pain in right hip: Secondary | ICD-10-CM | POA: Diagnosis not present

## 2016-05-30 DIAGNOSIS — Z471 Aftercare following joint replacement surgery: Secondary | ICD-10-CM | POA: Diagnosis not present

## 2016-05-30 DIAGNOSIS — M25662 Stiffness of left knee, not elsewhere classified: Secondary | ICD-10-CM | POA: Diagnosis not present

## 2016-06-04 DIAGNOSIS — M25562 Pain in left knee: Secondary | ICD-10-CM | POA: Diagnosis not present

## 2016-06-04 DIAGNOSIS — M6281 Muscle weakness (generalized): Secondary | ICD-10-CM | POA: Diagnosis not present

## 2016-06-04 DIAGNOSIS — M25662 Stiffness of left knee, not elsewhere classified: Secondary | ICD-10-CM | POA: Diagnosis not present

## 2016-06-06 DIAGNOSIS — M25662 Stiffness of left knee, not elsewhere classified: Secondary | ICD-10-CM | POA: Diagnosis not present

## 2016-06-06 DIAGNOSIS — M6281 Muscle weakness (generalized): Secondary | ICD-10-CM | POA: Diagnosis not present

## 2016-06-06 DIAGNOSIS — M25562 Pain in left knee: Secondary | ICD-10-CM | POA: Diagnosis not present

## 2016-06-07 DIAGNOSIS — I129 Hypertensive chronic kidney disease with stage 1 through stage 4 chronic kidney disease, or unspecified chronic kidney disease: Secondary | ICD-10-CM | POA: Diagnosis not present

## 2016-06-07 DIAGNOSIS — K219 Gastro-esophageal reflux disease without esophagitis: Secondary | ICD-10-CM | POA: Diagnosis not present

## 2016-06-07 DIAGNOSIS — Z6841 Body Mass Index (BMI) 40.0 and over, adult: Secondary | ICD-10-CM | POA: Diagnosis not present

## 2016-06-07 DIAGNOSIS — Z79899 Other long term (current) drug therapy: Secondary | ICD-10-CM | POA: Diagnosis not present

## 2016-06-07 DIAGNOSIS — R8299 Other abnormal findings in urine: Secondary | ICD-10-CM | POA: Diagnosis not present

## 2016-06-07 DIAGNOSIS — G4733 Obstructive sleep apnea (adult) (pediatric): Secondary | ICD-10-CM | POA: Diagnosis not present

## 2016-06-07 DIAGNOSIS — N182 Chronic kidney disease, stage 2 (mild): Secondary | ICD-10-CM | POA: Diagnosis not present

## 2016-06-07 DIAGNOSIS — E559 Vitamin D deficiency, unspecified: Secondary | ICD-10-CM | POA: Diagnosis not present

## 2016-06-07 DIAGNOSIS — Z8546 Personal history of malignant neoplasm of prostate: Secondary | ICD-10-CM | POA: Diagnosis not present

## 2016-06-07 DIAGNOSIS — E782 Mixed hyperlipidemia: Secondary | ICD-10-CM | POA: Diagnosis not present

## 2016-06-07 DIAGNOSIS — E785 Hyperlipidemia, unspecified: Secondary | ICD-10-CM | POA: Diagnosis not present

## 2016-06-07 DIAGNOSIS — C61 Malignant neoplasm of prostate: Secondary | ICD-10-CM | POA: Diagnosis not present

## 2016-06-07 DIAGNOSIS — N529 Male erectile dysfunction, unspecified: Secondary | ICD-10-CM | POA: Diagnosis not present

## 2016-06-07 DIAGNOSIS — I2510 Atherosclerotic heart disease of native coronary artery without angina pectoris: Secondary | ICD-10-CM | POA: Diagnosis not present

## 2016-06-07 DIAGNOSIS — N393 Stress incontinence (female) (male): Secondary | ICD-10-CM | POA: Diagnosis not present

## 2016-06-15 DIAGNOSIS — Z96652 Presence of left artificial knee joint: Secondary | ICD-10-CM | POA: Diagnosis not present

## 2016-06-15 DIAGNOSIS — Z471 Aftercare following joint replacement surgery: Secondary | ICD-10-CM | POA: Diagnosis not present

## 2016-08-31 DIAGNOSIS — J309 Allergic rhinitis, unspecified: Secondary | ICD-10-CM | POA: Diagnosis not present

## 2016-08-31 DIAGNOSIS — J069 Acute upper respiratory infection, unspecified: Secondary | ICD-10-CM | POA: Diagnosis not present

## 2016-10-08 IMAGING — DX DG CHEST 2V
2 series · 2 of 2 positions shown · non-contrast
Comparison: 09/11/2009

CLINICAL DATA: Productive cough.  Low-grade fever.

EXAM:
CHEST  2 VIEW

[chest pa]
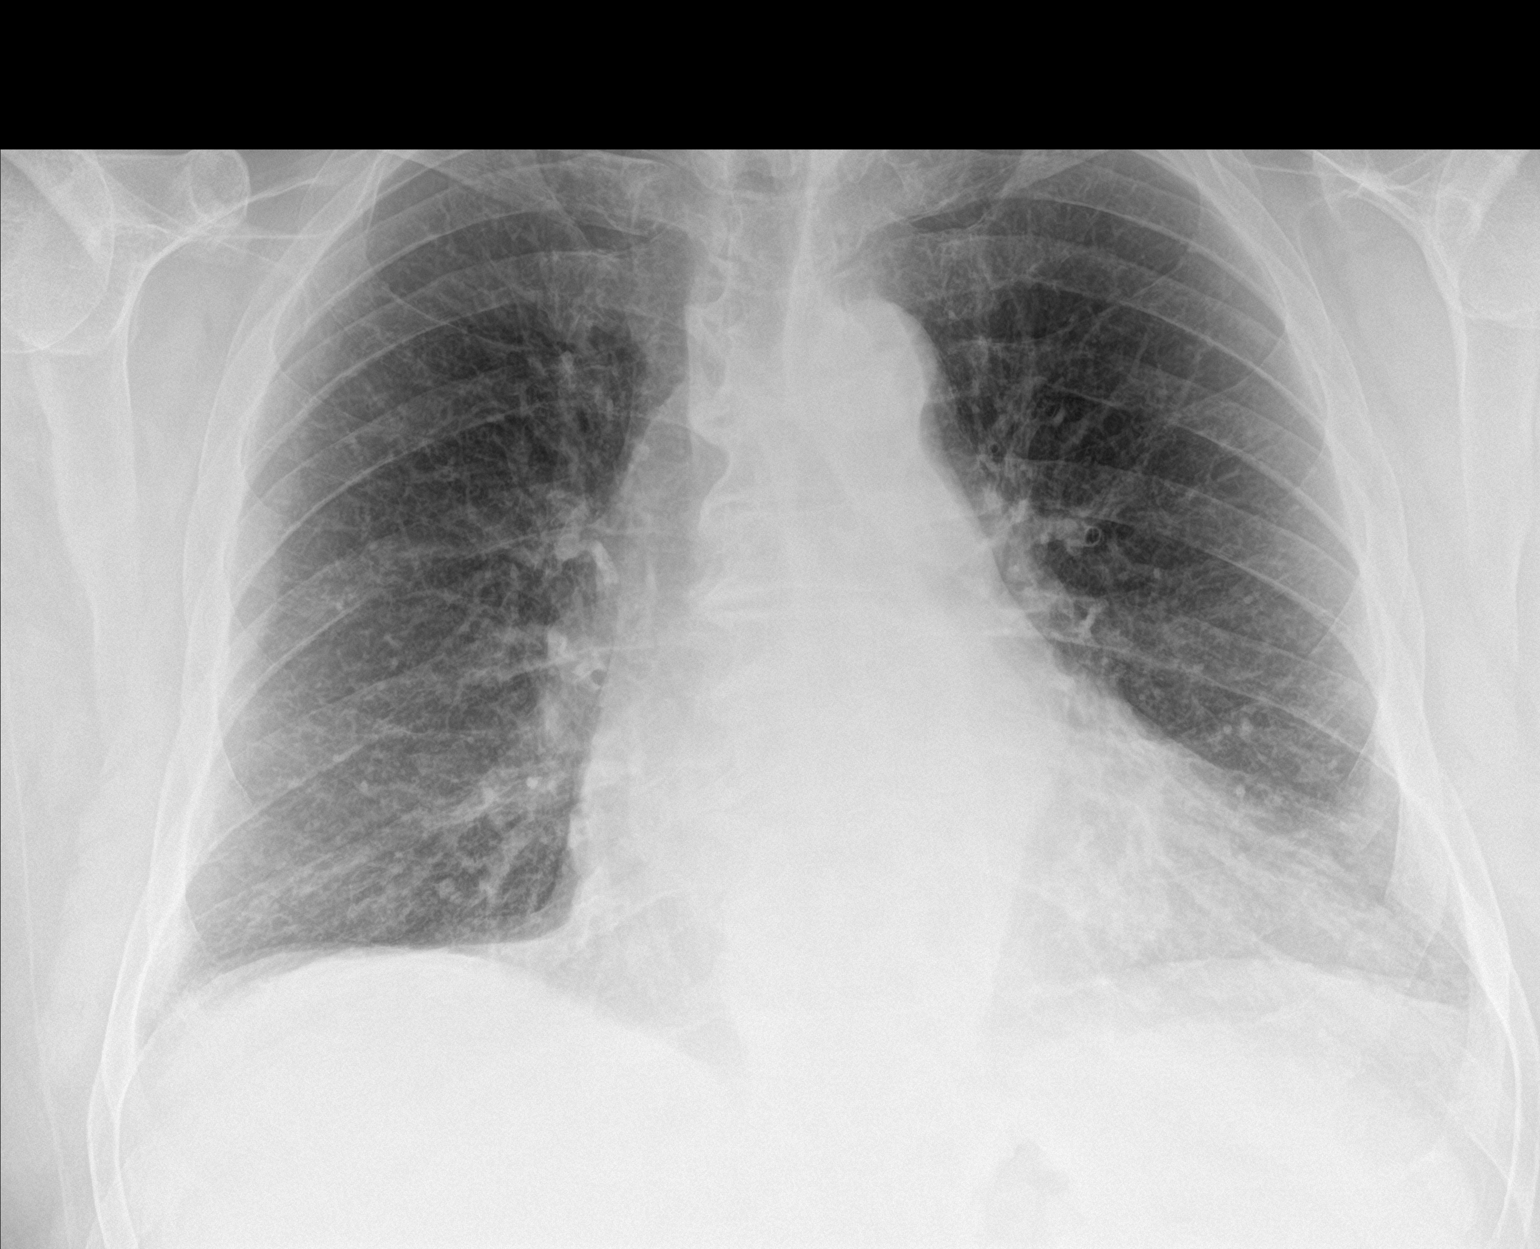

[chest lat]
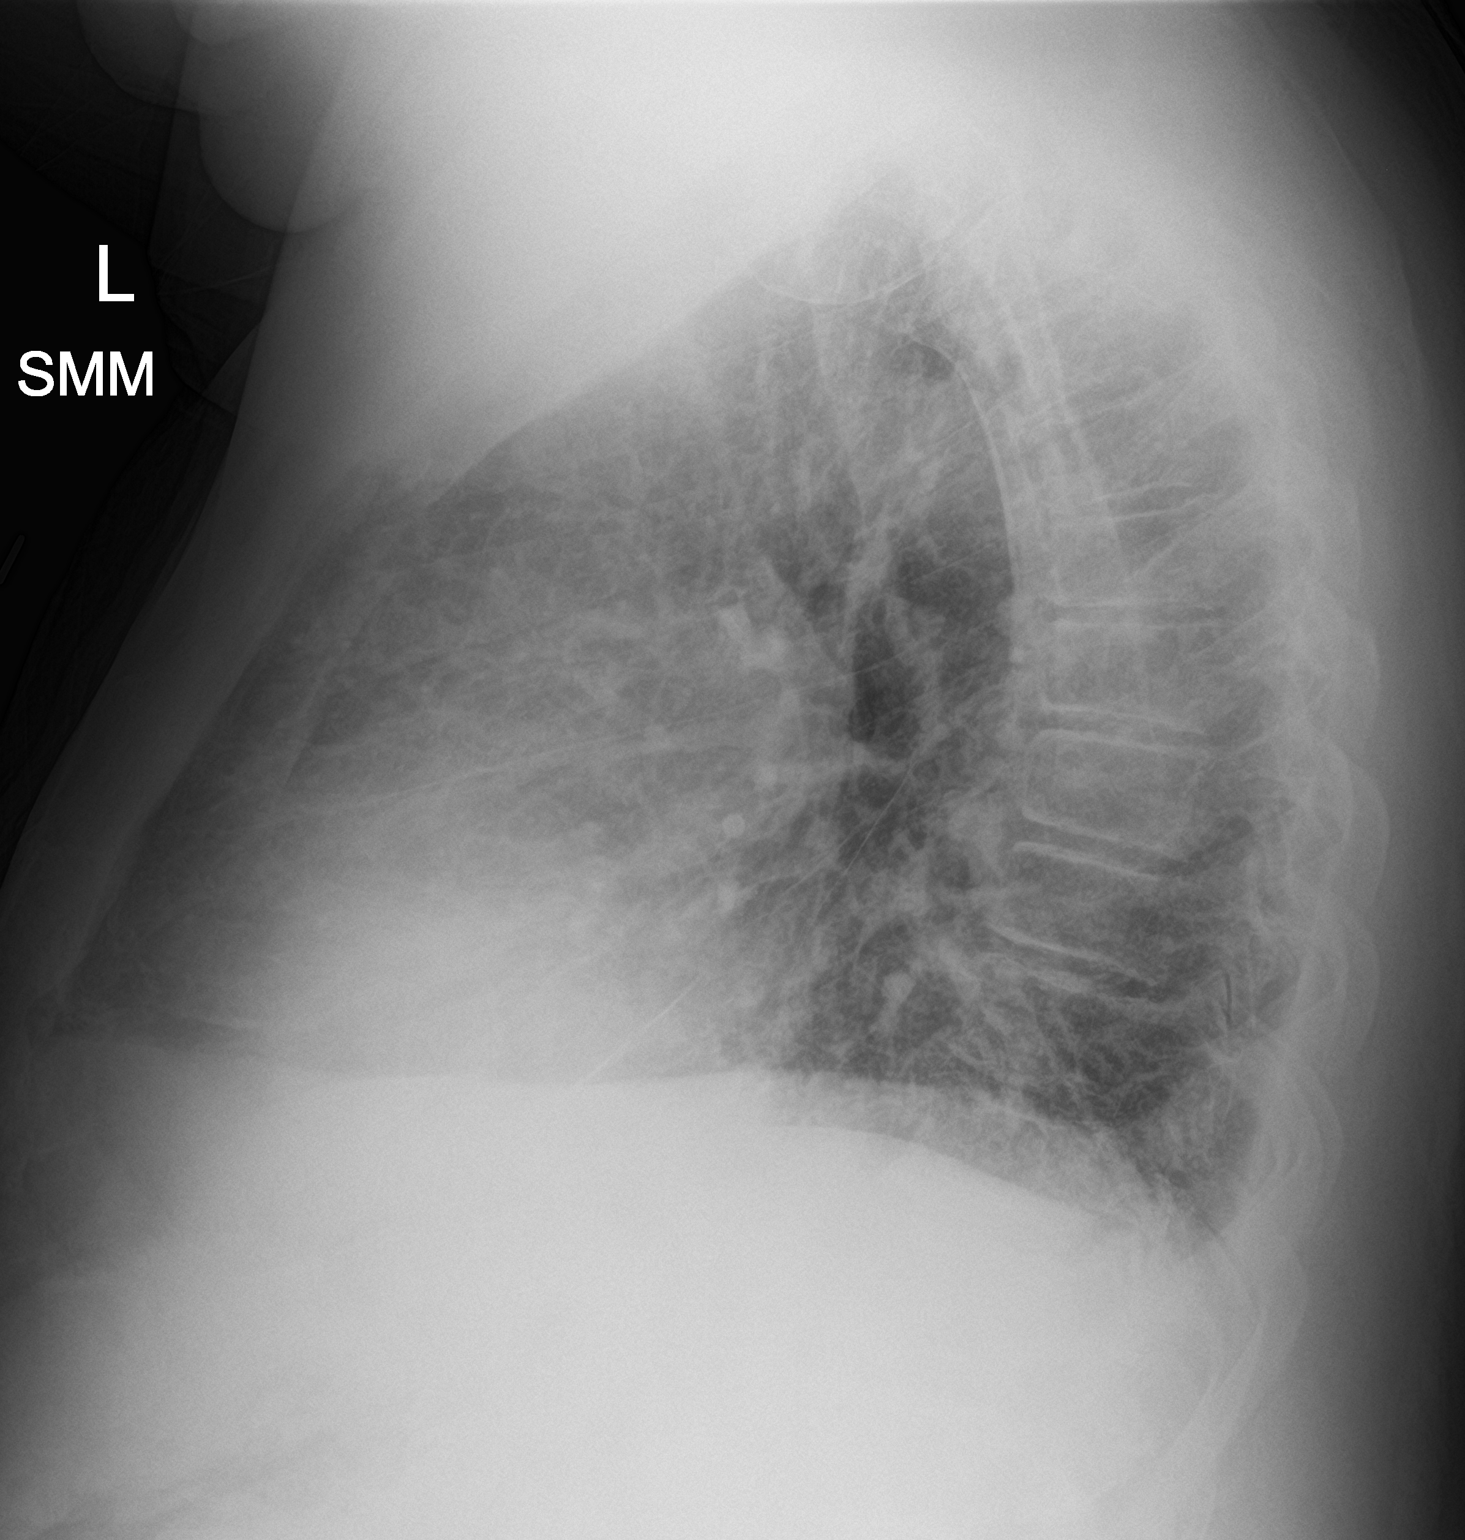

[2 of 2 positions shown; findings below may reference images not displayed]

FINDINGS: Mild cardiomegaly and ectasia thoracic aorta are stable. Ill-defined
focal opacity seen in the retrocardiac left lower lobe is seen which
may be due to pneumonia or possibly neoplasm. No evidence of pleural
effusion.
IMPRESSION: Focal ill-defined opacity in retrocardiac left lower lobe, which may
be due to pneumonia or possibly neoplasm. Recommend continued chest
x-ray follow-up in several weeks to confirm resolution, or chest CT
without contrast for further evaluation.

## 2016-11-20 DIAGNOSIS — Z9181 History of falling: Secondary | ICD-10-CM | POA: Diagnosis not present

## 2016-11-20 DIAGNOSIS — G2581 Restless legs syndrome: Secondary | ICD-10-CM | POA: Diagnosis not present

## 2016-11-20 DIAGNOSIS — E7800 Pure hypercholesterolemia, unspecified: Secondary | ICD-10-CM | POA: Diagnosis not present

## 2016-11-20 DIAGNOSIS — E559 Vitamin D deficiency, unspecified: Secondary | ICD-10-CM | POA: Diagnosis not present

## 2016-11-20 DIAGNOSIS — I10 Essential (primary) hypertension: Secondary | ICD-10-CM | POA: Diagnosis not present

## 2016-11-20 DIAGNOSIS — Z1389 Encounter for screening for other disorder: Secondary | ICD-10-CM | POA: Diagnosis not present

## 2016-11-20 DIAGNOSIS — K219 Gastro-esophageal reflux disease without esophagitis: Secondary | ICD-10-CM | POA: Diagnosis not present

## 2016-12-06 DIAGNOSIS — J208 Acute bronchitis due to other specified organisms: Secondary | ICD-10-CM | POA: Diagnosis not present

## 2016-12-06 DIAGNOSIS — R509 Fever, unspecified: Secondary | ICD-10-CM | POA: Diagnosis not present

## 2016-12-24 DIAGNOSIS — E7800 Pure hypercholesterolemia, unspecified: Secondary | ICD-10-CM | POA: Diagnosis not present

## 2016-12-24 DIAGNOSIS — I10 Essential (primary) hypertension: Secondary | ICD-10-CM | POA: Diagnosis not present

## 2016-12-24 DIAGNOSIS — Z136 Encounter for screening for cardiovascular disorders: Secondary | ICD-10-CM | POA: Diagnosis not present

## 2016-12-24 DIAGNOSIS — E559 Vitamin D deficiency, unspecified: Secondary | ICD-10-CM | POA: Diagnosis not present

## 2016-12-24 DIAGNOSIS — Z0000 Encounter for general adult medical examination without abnormal findings: Secondary | ICD-10-CM | POA: Diagnosis not present

## 2016-12-24 DIAGNOSIS — Z1389 Encounter for screening for other disorder: Secondary | ICD-10-CM | POA: Diagnosis not present

## 2016-12-24 DIAGNOSIS — R7303 Prediabetes: Secondary | ICD-10-CM | POA: Diagnosis not present

## 2016-12-24 DIAGNOSIS — E669 Obesity, unspecified: Secondary | ICD-10-CM | POA: Diagnosis not present

## 2016-12-24 DIAGNOSIS — Z9181 History of falling: Secondary | ICD-10-CM | POA: Diagnosis not present

## 2016-12-24 DIAGNOSIS — Z6841 Body Mass Index (BMI) 40.0 and over, adult: Secondary | ICD-10-CM | POA: Diagnosis not present

## 2016-12-24 DIAGNOSIS — C61 Malignant neoplasm of prostate: Secondary | ICD-10-CM | POA: Diagnosis not present

## 2016-12-24 DIAGNOSIS — E785 Hyperlipidemia, unspecified: Secondary | ICD-10-CM | POA: Diagnosis not present

## 2016-12-28 DIAGNOSIS — Z1212 Encounter for screening for malignant neoplasm of rectum: Secondary | ICD-10-CM | POA: Diagnosis not present

## 2016-12-28 DIAGNOSIS — Z1211 Encounter for screening for malignant neoplasm of colon: Secondary | ICD-10-CM | POA: Diagnosis not present

## 2017-11-30 ENCOUNTER — Emergency Department (HOSPITAL_BASED_OUTPATIENT_CLINIC_OR_DEPARTMENT_OTHER): Payer: Medicare PPO

## 2017-11-30 ENCOUNTER — Encounter (HOSPITAL_COMMUNITY): Payer: Self-pay | Admitting: *Deleted

## 2017-11-30 ENCOUNTER — Emergency Department (HOSPITAL_COMMUNITY)
Admission: EM | Admit: 2017-11-30 | Discharge: 2017-11-30 | Disposition: A | Discharge status: Home / Self Care | Payer: Medicare PPO | Attending: Emergency Medicine | Admitting: Emergency Medicine

## 2017-11-30 DIAGNOSIS — R609 Edema, unspecified: Secondary | ICD-10-CM | POA: Diagnosis not present

## 2017-11-30 DIAGNOSIS — R2243 Localized swelling, mass and lump, lower limb, bilateral: Secondary | ICD-10-CM | POA: Diagnosis present

## 2017-11-30 DIAGNOSIS — Z79899 Other long term (current) drug therapy: Secondary | ICD-10-CM | POA: Diagnosis not present

## 2017-11-30 DIAGNOSIS — Z9660 Presence of unspecified orthopedic joint implant: Secondary | ICD-10-CM | POA: Diagnosis not present

## 2017-11-30 DIAGNOSIS — Z87891 Personal history of nicotine dependence: Secondary | ICD-10-CM | POA: Diagnosis not present

## 2017-11-30 DIAGNOSIS — R600 Localized edema: Secondary | ICD-10-CM | POA: Diagnosis not present

## 2017-11-30 DIAGNOSIS — Z7982 Long term (current) use of aspirin: Secondary | ICD-10-CM | POA: Insufficient documentation

## 2017-11-30 DIAGNOSIS — I10 Essential (primary) hypertension: Secondary | ICD-10-CM | POA: Insufficient documentation

## 2017-11-30 HISTORY — DX: Essential (primary) hypertension: I10

## 2017-11-30 NOTE — ED Triage Notes (Signed)
Pt complains of swelling to left leg for ~1 week. 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

## 2017-11-30 NOTE — ED Notes (Signed)
Patient ambulatory to restroom without difficulty. 

## 2017-11-30 NOTE — ED Provider Notes (Signed)
[facility] DEPT Provider Note   CSN: [id number] Arrival date & time: 11/30/17  1153     History   Chief Complaint Chief Complaint  Patient presents with  . Leg Swelling    HPI [name] is a 77 y.o. male.  Patient with history of hypertension who presents the ED with left greater than right leg swelling.  Family concerned about a blood clot.  Patient is a Administrator.  He states that when he sits for a long time the swelling appears to get worse.  Swelling is worse in the left greater than the right, has had an surgery in the left before in the past.  Denies any heart failure, liver problems, kidney disease.  Denies any pain in his lower extremities.  No fevers, no chills.  The history is provided by the patient.  Illness  This is a recurrent problem. The current episode started more than 1 week ago. The problem occurs every several days. The problem has been gradually improving. Pertinent negatives include no chest pain, no abdominal pain and no shortness of breath. Exacerbated by: sitting. The symptoms are relieved by rest. He has tried nothing for the symptoms. The treatment provided no relief.    Past Medical History:  Diagnosis Date  . Hypertension     There are no active problems to display for this patient.   Past Surgical History:  Procedure Laterality Date  . JOINT REPLACEMENT          Home Medications    Prior to Admission medications   Medication Sig Start Date End Date Taking? Authorizing Provider  aspirin 81 MG chewable tablet Chew 81 mg by mouth daily.   Yes [provider]  gabapentin (NEURONTIN) 600 MG tablet Take 600 mg by mouth daily as needed for pain. 11/02/17  Yes [provider]  lisinopril (PRINIVIL,ZESTRIL) 5 MG tablet Take 5 mg by mouth daily. 07/29/15  Yes [provider]  metoprolol tartrate (LOPRESSOR) 25 MG tablet Take 25 mg by mouth 2 (two) times daily. 07/13/15  Yes [provider]  omeprazole (PRILOSEC) 20 MG capsule Take 20 mg by mouth daily. 07/29/15  Yes [provider]  pravastatin (PRAVACHOL) 40 MG tablet Take 40 mg by mouth daily. 07/29/15  Yes [provider]  traZODone (DESYREL) 50 MG tablet Take 50 mg by mouth daily. 11/25/17  Yes [provider]  Vitamin D, Ergocalciferol, (DRISDOL) 50000 units CAPS capsule Take 50,000 Units by mouth once a week. 05/23/16  Yes [provider]    Family History No family history on file.  Social History Social History   Tobacco Use  . Smoking status: Former Smoker    Last attempt to quit: 09/02/1985    Years since quitting: 32.2  Substance Use Topics  . Alcohol use: No  . Drug use: No     Allergies   Percocet [oxycodone-acetaminophen]   Review of Systems Review of Systems  Constitutional: Negative for chills and fever.  HENT: Negative for ear pain and sore throat.   Eyes: Negative for pain and visual disturbance.  Respiratory: Negative for cough and shortness of breath.   Cardiovascular: Positive for leg swelling. Negative for chest pain and palpitations.  Gastrointestinal: Negative for abdominal pain and vomiting.  Genitourinary: Negative for dysuria and hematuria.  Musculoskeletal: Negative for arthralgias and back pain.  Skin: Negative for color change and rash.  Neurological: Negative for seizures and syncope.  All other systems reviewed and  are negative.    Physical Exam Updated Vital Signs  ED Triage Vitals  Enc Vitals Group     BP 11/30/17 1157 130/78     Pulse Rate 11/30/17 1157 81     Resp 11/30/17 1157 16     Temp 11/30/17 1157 (!) 97.5 F (36.4 C)     Temp Source 11/30/17 1157 Oral     SpO2 11/30/17 1157 97 %     Weight 11/30/17 1157 (!) 310 lb (140.6 kg)     Height 11/30/17 1157 5\' 11"  (1.803 m)     Head Circumference --      Peak Flow --      Pain Score 11/30/17 1200 0     Pain Loc --      Pain Edu? --      Excl. in [place]? --      Physical Exam  Constitutional: He appears well-developed and well-nourished. No distress.  HENT:  Head: Normocephalic and atraumatic.  Eyes: Pupils are equal, round, and reactive to light. Conjunctivae and EOM are normal.  Neck: Neck supple.  Cardiovascular: Normal rate and regular rhythm.  No murmur heard. Pulmonary/Chest: Effort normal and breath sounds normal. No stridor. No respiratory distress. He has no wheezes. He has no rales.  Abdominal: Soft. There is no tenderness.  Musculoskeletal: He exhibits edema (1+ pitting edema b/l).  Neurological: He is alert.  Skin: Skin is warm and dry.  Psychiatric: He has a normal mood and affect.  Nursing note and vitals reviewed.    ED Treatments / Results  Labs (all labs ordered are listed, but only abnormal results are displayed) Labs Reviewed - No data to display  EKG None  Radiology No results found.  Procedures Procedures (including critical care time)  Medications Ordered in ED Medications - No data to display   Initial Impression / Assessment and Plan / ED Course  I have reviewed the triage vital signs and the nursing notes.  Pertinent labs & imaging results that were available during my care of the patient were reviewed by me and considered in my medical decision making (see chart for details).     [name] is a 77 year old male with history of hypertension who presents to the ED with lower leg swelling.  Patient unremarkable vitals.  No fever.  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  Patient with intermittent swelling of bilateral legs but worse on the left side.  Patient has no signs of infection of his lower extremities.  Has no shortness of breath, no chest pain.  Patient has no history of heart failure.  Patient had recent lab work that showed no renal disease. 1+ pitting edema b/l.  DVT ultrasound  was performed that showed no acute DVT.  Suspect patient likely has some venous stasis process.  However recommend that he follow-up with primary care doctor for further work-up, may benefit from an echocardiogram.  Recommend compression stockings.  Discharged from ED good condition and told to return to ED if symptoms worsen.  Final Clinical Impressions(s) / ED Diagnoses   Final diagnoses:  Leg edema    ED Discharge Orders    None       Lennice Sites, DO 11/30/17 1527

## 2017-11-30 NOTE — ED Notes (Signed)
US at bedside

## 2017-11-30 NOTE — ED Notes (Signed)
Patient and family expressing concern of wait time for [place]. Made aware per secretary, [place] tech has been paged. Secretary asked to page [place] again to determine ETA.

## 2017-11-30 NOTE — Progress Notes (Signed)
VASCULAR LAB PRELIMINARY  PRELIMINARY  PRELIMINARY  PRELIMINARY  Bilateral lower extremity venous duplex completed.    Preliminary report:  There is no obvious evidence of DVT or SVT noted in the bilateral lower extremities.   Gave report to Dr. Vonda Antigua, [facility], RVT 11/30/2017, 3:07 PM

## 2017-12-30 ENCOUNTER — Encounter: Payer: Self-pay | Admitting: Podiatry

## 2017-12-30 ENCOUNTER — Ambulatory Visit: Payer: Medicare PPO | Admitting: Podiatry

## 2017-12-30 ENCOUNTER — Ambulatory Visit (INDEPENDENT_AMBULATORY_CARE_PROVIDER_SITE_OTHER): Payer: Medicare PPO

## 2017-12-30 VITALS — BP 133/80 | HR 78 | Ht 71.0 in | Wt 317.0 lb

## 2017-12-30 DIAGNOSIS — M79671 Pain in right foot: Secondary | ICD-10-CM

## 2017-12-30 DIAGNOSIS — D229 Melanocytic nevi, unspecified: Secondary | ICD-10-CM

## 2017-12-30 DIAGNOSIS — M79672 Pain in left foot: Secondary | ICD-10-CM

## 2017-12-30 DIAGNOSIS — M722 Plantar fascial fibromatosis: Secondary | ICD-10-CM | POA: Diagnosis not present

## 2017-12-30 DIAGNOSIS — G629 Polyneuropathy, unspecified: Secondary | ICD-10-CM | POA: Diagnosis not present

## 2017-12-30 DIAGNOSIS — G5763 Lesion of plantar nerve, bilateral lower limbs: Secondary | ICD-10-CM | POA: Diagnosis not present

## 2017-12-30 DIAGNOSIS — L989 Disorder of the skin and subcutaneous tissue, unspecified: Secondary | ICD-10-CM

## 2017-12-30 NOTE — Progress Notes (Addendum)
  Subjective:  Patient ID: [name], male    DOB: 1940-03-22,  MRN: [id number]  Chief Complaint  Patient presents with  . Foot Pain    Bilateral foot pain for a year or 2 feels like wallking on rocks and feet are on fire entire foot hurts into heels pt states he is not diabetic and has tried over the counter inserts that did not help    77 y.o. male presents with the above complaint. History as above.  Review of Systems: Negative except as noted in the HPI. Denies N/V/F/Ch.  Past Medical History:  Diagnosis Date  . Hypertension     Current Outpatient Medications:  .  aspirin 81 MG chewable tablet, Chew 81 mg by mouth daily., Disp: , Rfl:  .  gabapentin (NEURONTIN) 600 MG tablet, Take 600 mg by mouth daily as needed for pain., Disp: , Rfl:  .  lisinopril (PRINIVIL,ZESTRIL) 5 MG tablet, Take 5 mg by mouth daily., Disp: , Rfl:  .  metoprolol tartrate (LOPRESSOR) 25 MG tablet, Take 25 mg by mouth 2 (two) times daily., Disp: , Rfl:  .  omeprazole (PRILOSEC) 20 MG capsule, Take 20 mg by mouth daily., Disp: , Rfl:  .  pravastatin (PRAVACHOL) 40 MG tablet, Take 40 mg by mouth daily., Disp: , Rfl:  .  traZODone (DESYREL) 50 MG tablet, Take 50 mg by mouth daily., Disp: , Rfl:  .  Vitamin D, Ergocalciferol, (DRISDOL) 50000 units CAPS capsule, Take 50,000 Units by mouth once a week., Disp: , Rfl:  .  NONFORMULARY OR COMPOUNDED ITEM, Apply 1-2 g topically 4 (four) times daily. Peripheral Neuropathy cream: Bupivacaine 1% Doxepin 3% Gabapentin 6% Ibuprofen 3% Pentoxifylline 3%, Disp: 120 each, Rfl: 2  Social History   Tobacco Use  Smoking Status Former Smoker  . Last attempt to quit: 09/02/1985  . Years since quitting: 32.3  Smokeless Tobacco Never Used    Allergies  Allergen Reactions  . Percocet [Oxycodone-Acetaminophen] Rash   Objective:   Vitals:   12/30/17 1144  BP: 133/80  Pulse: 78   Body mass index is 44.21 kg/m. Constitutional Well developed. Well nourished.  Vascular  Dorsalis pedis pulses palpable bilaterally. Posterior tibial pulses palpable bilaterally. Capillary refill normal to all digits.  No cyanosis or clubbing noted. Pedal hair growth normal.  Neurologic Normal speech. Oriented to person, place, and time. Epicritic sensation to light touch grossly present bilaterally.  Dermatologic Nails well groomed and normal in appearance. No open wounds. Suspicious verrucous lesion right forefoot  Orthopedic: Normal joint ROM without pain or crepitus bilaterally. No visible deformities. POP 3rd interspace bilat.   Radiographs: Taken and reviewed no acute fractures or dislocations. Assessment:   1. Plantar fasciitis   2. Neuropathy   3. Bilateral foot pain   4. Morton's neuroma of both feet   5. Suspicious nevus   6. Benign skin lesion    Plan:  Patient was evaluated and treated and all questions answered.  Peripheral Neuropathy -Rx Compound Neuropathic cream.  Suspicious Nevus R Foot -Shave biopsy as below.  Procedure: Shave Biopsy Indication: suspicious nevus / verruca Anesthesia: Lidocaine 1% with epi 2cc Instrumentation: 15 blade Technique: Tangential shave Dressing: Dry, sterile, compression dressing. Specimen: Labeled and sent for pathology. Disposition: Patient tolerated procedure well. Leave dressing on for 48 hours. Thereafter dress with antibiotic ointment and band-aid. Off-loading pads dispensed. Patient to return in 2 week for follow-up.  Return in about 2 weeks (around 01/13/2018).

## 2017-12-31 MED ORDER — NONFORMULARY OR COMPOUNDED ITEM: 1.0000 g | Freq: Four times a day (QID) | 2 refills | Status: AC

## 2017-12-31 NOTE — Addendum Note (Signed)
Addended by: Johnnye Lana A on: 12/31/2017 09:42 AM   Modules accepted: Orders

## 2017-12-31 NOTE — Addendum Note (Signed)
Addended by: Johnnye Lana A on: 12/31/2017 09:51 AM   Modules accepted: Orders

## 2018-01-06 ENCOUNTER — Other Ambulatory Visit: Payer: Self-pay | Admitting: Podiatry

## 2018-01-06 DIAGNOSIS — M79671 Pain in right foot: Secondary | ICD-10-CM

## 2018-01-06 DIAGNOSIS — M722 Plantar fascial fibromatosis: Secondary | ICD-10-CM

## 2018-01-06 DIAGNOSIS — M79672 Pain in left foot: Secondary | ICD-10-CM

## 2018-01-06 DIAGNOSIS — D229 Melanocytic nevi, unspecified: Secondary | ICD-10-CM

## 2018-01-06 DIAGNOSIS — G629 Polyneuropathy, unspecified: Secondary | ICD-10-CM

## 2018-01-10 ENCOUNTER — Telehealth: Payer: Self-pay | Admitting: Podiatry

## 2018-01-10 NOTE — Telephone Encounter (Signed)
[name] request the exact location of the biopsy sent 12/30/2017. Dr. March Rummage states right dorsal forefoot near the sulcus. I informed [facility].

## 2018-01-10 NOTE — Telephone Encounter (Signed)
[facility] called wanting to know the exact biopsy location for the patient.  Can you give them a call please?  [facility] [phone] Ext [phone]

## 2018-01-13 ENCOUNTER — Ambulatory Visit: Payer: Medicare PPO | Admitting: Podiatry

## 2018-05-05 DIAGNOSIS — C61 Malignant neoplasm of prostate: Secondary | ICD-10-CM | POA: Diagnosis not present

## 2019-04-07 DIAGNOSIS — Z136 Encounter for screening for cardiovascular disorders: Secondary | ICD-10-CM | POA: Diagnosis not present

## 2019-04-07 DIAGNOSIS — Z9181 History of falling: Secondary | ICD-10-CM | POA: Diagnosis not present

## 2019-04-07 DIAGNOSIS — E785 Hyperlipidemia, unspecified: Secondary | ICD-10-CM | POA: Diagnosis not present

## 2019-04-07 DIAGNOSIS — Z0000 Encounter for general adult medical examination without abnormal findings: Secondary | ICD-10-CM | POA: Diagnosis not present

## 2019-05-09 DIAGNOSIS — Z7982 Long term (current) use of aspirin: Secondary | ICD-10-CM | POA: Diagnosis not present

## 2019-05-09 DIAGNOSIS — E785 Hyperlipidemia, unspecified: Secondary | ICD-10-CM | POA: Diagnosis not present

## 2019-05-09 DIAGNOSIS — R040 Epistaxis: Secondary | ICD-10-CM | POA: Diagnosis not present

## 2019-05-09 DIAGNOSIS — I10 Essential (primary) hypertension: Secondary | ICD-10-CM | POA: Diagnosis not present

## 2019-05-09 DIAGNOSIS — R000 Tachycardia, unspecified: Secondary | ICD-10-CM | POA: Diagnosis not present

## 2019-05-09 DIAGNOSIS — K219 Gastro-esophageal reflux disease without esophagitis: Secondary | ICD-10-CM | POA: Diagnosis not present

## 2019-05-09 DIAGNOSIS — Z87891 Personal history of nicotine dependence: Secondary | ICD-10-CM | POA: Diagnosis not present

## 2019-05-09 DIAGNOSIS — Z79899 Other long term (current) drug therapy: Secondary | ICD-10-CM | POA: Diagnosis not present

## 2019-05-10 DIAGNOSIS — R000 Tachycardia, unspecified: Secondary | ICD-10-CM | POA: Diagnosis not present

## 2019-05-10 DIAGNOSIS — R040 Epistaxis: Secondary | ICD-10-CM | POA: Diagnosis not present

## 2019-05-10 DIAGNOSIS — I10 Essential (primary) hypertension: Secondary | ICD-10-CM | POA: Diagnosis not present

## 2019-05-11 DIAGNOSIS — R000 Tachycardia, unspecified: Secondary | ICD-10-CM | POA: Diagnosis not present

## 2019-05-11 DIAGNOSIS — I10 Essential (primary) hypertension: Secondary | ICD-10-CM | POA: Diagnosis not present

## 2019-05-11 DIAGNOSIS — R040 Epistaxis: Secondary | ICD-10-CM | POA: Diagnosis not present

## 2019-05-14 DIAGNOSIS — R040 Epistaxis: Secondary | ICD-10-CM | POA: Diagnosis not present

## 2019-05-14 DIAGNOSIS — J342 Deviated nasal septum: Secondary | ICD-10-CM | POA: Diagnosis not present

## 2019-05-14 DIAGNOSIS — T171XXA Foreign body in nostril, initial encounter: Secondary | ICD-10-CM | POA: Diagnosis not present

## 2019-05-14 DIAGNOSIS — I10 Essential (primary) hypertension: Secondary | ICD-10-CM | POA: Diagnosis not present

## 2019-05-14 DIAGNOSIS — J310 Chronic rhinitis: Secondary | ICD-10-CM | POA: Diagnosis not present

## 2019-05-18 DIAGNOSIS — E1165 Type 2 diabetes mellitus with hyperglycemia: Secondary | ICD-10-CM | POA: Diagnosis not present

## 2019-05-18 DIAGNOSIS — R040 Epistaxis: Secondary | ICD-10-CM | POA: Diagnosis not present

## 2019-05-18 DIAGNOSIS — E538 Deficiency of other specified B group vitamins: Secondary | ICD-10-CM | POA: Diagnosis not present

## 2019-05-18 DIAGNOSIS — Z79899 Other long term (current) drug therapy: Secondary | ICD-10-CM | POA: Diagnosis not present

## 2019-05-18 DIAGNOSIS — I10 Essential (primary) hypertension: Secondary | ICD-10-CM | POA: Diagnosis not present

## 2019-06-16 DIAGNOSIS — B9689 Other specified bacterial agents as the cause of diseases classified elsewhere: Secondary | ICD-10-CM | POA: Diagnosis not present

## 2019-06-16 DIAGNOSIS — J019 Acute sinusitis, unspecified: Secondary | ICD-10-CM | POA: Diagnosis not present

## 2019-06-30 DIAGNOSIS — J209 Acute bronchitis, unspecified: Secondary | ICD-10-CM | POA: Diagnosis not present

## 2019-06-30 DIAGNOSIS — E538 Deficiency of other specified B group vitamins: Secondary | ICD-10-CM | POA: Diagnosis not present

## 2019-07-07 DIAGNOSIS — Z20822 Contact with and (suspected) exposure to covid-19: Secondary | ICD-10-CM | POA: Diagnosis not present

## 2019-07-07 DIAGNOSIS — B9689 Other specified bacterial agents as the cause of diseases classified elsewhere: Secondary | ICD-10-CM | POA: Diagnosis not present

## 2019-07-07 DIAGNOSIS — J208 Acute bronchitis due to other specified organisms: Secondary | ICD-10-CM | POA: Diagnosis not present

## 2019-07-07 DIAGNOSIS — J019 Acute sinusitis, unspecified: Secondary | ICD-10-CM | POA: Diagnosis not present

## 2019-09-25 DIAGNOSIS — E1165 Type 2 diabetes mellitus with hyperglycemia: Secondary | ICD-10-CM | POA: Diagnosis not present

## 2019-09-25 DIAGNOSIS — M1990 Unspecified osteoarthritis, unspecified site: Secondary | ICD-10-CM | POA: Diagnosis not present

## 2019-09-25 DIAGNOSIS — E7800 Pure hypercholesterolemia, unspecified: Secondary | ICD-10-CM | POA: Diagnosis not present

## 2019-09-25 DIAGNOSIS — I10 Essential (primary) hypertension: Secondary | ICD-10-CM | POA: Diagnosis not present

## 2019-09-25 DIAGNOSIS — Z01810 Encounter for preprocedural cardiovascular examination: Secondary | ICD-10-CM | POA: Diagnosis not present

## 2019-10-26 DIAGNOSIS — J342 Deviated nasal septum: Secondary | ICD-10-CM | POA: Diagnosis not present

## 2019-10-26 DIAGNOSIS — R0981 Nasal congestion: Secondary | ICD-10-CM | POA: Diagnosis not present

## 2019-10-26 DIAGNOSIS — I10 Essential (primary) hypertension: Secondary | ICD-10-CM | POA: Diagnosis not present

## 2019-10-26 DIAGNOSIS — R040 Epistaxis: Secondary | ICD-10-CM | POA: Diagnosis not present

## 2019-10-29 DIAGNOSIS — Z1159 Encounter for screening for other viral diseases: Secondary | ICD-10-CM | POA: Diagnosis not present

## 2019-11-04 DIAGNOSIS — I10 Essential (primary) hypertension: Secondary | ICD-10-CM | POA: Diagnosis not present

## 2019-11-04 DIAGNOSIS — K219 Gastro-esophageal reflux disease without esophagitis: Secondary | ICD-10-CM | POA: Diagnosis not present

## 2019-11-04 DIAGNOSIS — R0981 Nasal congestion: Secondary | ICD-10-CM | POA: Diagnosis not present

## 2019-11-04 DIAGNOSIS — Z7982 Long term (current) use of aspirin: Secondary | ICD-10-CM | POA: Diagnosis not present

## 2019-11-04 DIAGNOSIS — E119 Type 2 diabetes mellitus without complications: Secondary | ICD-10-CM | POA: Diagnosis not present

## 2019-11-04 DIAGNOSIS — G4730 Sleep apnea, unspecified: Secondary | ICD-10-CM | POA: Diagnosis not present

## 2019-11-04 DIAGNOSIS — R040 Epistaxis: Secondary | ICD-10-CM | POA: Diagnosis not present

## 2019-11-04 DIAGNOSIS — Z87891 Personal history of nicotine dependence: Secondary | ICD-10-CM | POA: Diagnosis not present

## 2019-11-04 DIAGNOSIS — E7800 Pure hypercholesterolemia, unspecified: Secondary | ICD-10-CM | POA: Diagnosis not present

## 2019-11-04 DIAGNOSIS — J342 Deviated nasal septum: Secondary | ICD-10-CM | POA: Diagnosis not present

## 2019-11-04 DIAGNOSIS — Z886 Allergy status to analgesic agent status: Secondary | ICD-10-CM | POA: Diagnosis not present

## 2019-11-04 DIAGNOSIS — J3489 Other specified disorders of nose and nasal sinuses: Secondary | ICD-10-CM | POA: Diagnosis not present

## 2019-11-04 DIAGNOSIS — M24611 Ankylosis, right shoulder: Secondary | ICD-10-CM | POA: Diagnosis not present

## 2020-02-01 DIAGNOSIS — I10 Essential (primary) hypertension: Secondary | ICD-10-CM | POA: Diagnosis not present

## 2020-02-01 DIAGNOSIS — M1990 Unspecified osteoarthritis, unspecified site: Secondary | ICD-10-CM | POA: Diagnosis not present

## 2020-02-01 DIAGNOSIS — E7800 Pure hypercholesterolemia, unspecified: Secondary | ICD-10-CM | POA: Diagnosis not present

## 2020-02-01 DIAGNOSIS — Z139 Encounter for screening, unspecified: Secondary | ICD-10-CM | POA: Diagnosis not present

## 2020-02-01 DIAGNOSIS — E1165 Type 2 diabetes mellitus with hyperglycemia: Secondary | ICD-10-CM | POA: Diagnosis not present

## 2020-03-08 DIAGNOSIS — I10 Essential (primary) hypertension: Secondary | ICD-10-CM | POA: Diagnosis not present

## 2020-03-08 DIAGNOSIS — E7800 Pure hypercholesterolemia, unspecified: Secondary | ICD-10-CM | POA: Diagnosis not present

## 2020-03-08 DIAGNOSIS — Z79899 Other long term (current) drug therapy: Secondary | ICD-10-CM | POA: Diagnosis not present

## 2020-03-08 DIAGNOSIS — Z87891 Personal history of nicotine dependence: Secondary | ICD-10-CM | POA: Diagnosis not present

## 2020-03-08 DIAGNOSIS — Z7982 Long term (current) use of aspirin: Secondary | ICD-10-CM | POA: Diagnosis not present

## 2020-03-08 DIAGNOSIS — M1990 Unspecified osteoarthritis, unspecified site: Secondary | ICD-10-CM | POA: Diagnosis not present

## 2020-03-08 DIAGNOSIS — R040 Epistaxis: Secondary | ICD-10-CM | POA: Diagnosis not present

## 2020-03-15 DIAGNOSIS — Z7982 Long term (current) use of aspirin: Secondary | ICD-10-CM | POA: Diagnosis not present

## 2020-03-15 DIAGNOSIS — J069 Acute upper respiratory infection, unspecified: Secondary | ICD-10-CM | POA: Diagnosis not present

## 2020-03-15 DIAGNOSIS — R040 Epistaxis: Secondary | ICD-10-CM | POA: Diagnosis not present

## 2020-03-15 DIAGNOSIS — I10 Essential (primary) hypertension: Secondary | ICD-10-CM | POA: Diagnosis not present

## 2020-04-11 DIAGNOSIS — E669 Obesity, unspecified: Secondary | ICD-10-CM | POA: Diagnosis not present

## 2020-04-11 DIAGNOSIS — E785 Hyperlipidemia, unspecified: Secondary | ICD-10-CM | POA: Diagnosis not present

## 2020-04-11 DIAGNOSIS — Z9181 History of falling: Secondary | ICD-10-CM | POA: Diagnosis not present

## 2020-04-11 DIAGNOSIS — Z1331 Encounter for screening for depression: Secondary | ICD-10-CM | POA: Diagnosis not present

## 2020-04-11 DIAGNOSIS — Z0000 Encounter for general adult medical examination without abnormal findings: Secondary | ICD-10-CM | POA: Diagnosis not present

## 2020-05-09 DIAGNOSIS — E1165 Type 2 diabetes mellitus with hyperglycemia: Secondary | ICD-10-CM | POA: Diagnosis not present

## 2020-05-09 DIAGNOSIS — Z6838 Body mass index (BMI) 38.0-38.9, adult: Secondary | ICD-10-CM | POA: Diagnosis not present

## 2020-05-09 DIAGNOSIS — M1990 Unspecified osteoarthritis, unspecified site: Secondary | ICD-10-CM | POA: Diagnosis not present

## 2020-05-09 DIAGNOSIS — E559 Vitamin D deficiency, unspecified: Secondary | ICD-10-CM | POA: Diagnosis not present

## 2020-05-09 DIAGNOSIS — I10 Essential (primary) hypertension: Secondary | ICD-10-CM | POA: Diagnosis not present

## 2020-05-09 DIAGNOSIS — G44221 Chronic tension-type headache, intractable: Secondary | ICD-10-CM | POA: Diagnosis not present

## 2020-05-09 DIAGNOSIS — Z125 Encounter for screening for malignant neoplasm of prostate: Secondary | ICD-10-CM | POA: Diagnosis not present

## 2020-05-09 DIAGNOSIS — K219 Gastro-esophageal reflux disease without esophagitis: Secondary | ICD-10-CM | POA: Diagnosis not present

## 2020-05-09 DIAGNOSIS — E7800 Pure hypercholesterolemia, unspecified: Secondary | ICD-10-CM | POA: Diagnosis not present

## 2020-05-09 DIAGNOSIS — G2581 Restless legs syndrome: Secondary | ICD-10-CM | POA: Diagnosis not present

## 2020-06-16 DIAGNOSIS — J019 Acute sinusitis, unspecified: Secondary | ICD-10-CM | POA: Diagnosis not present

## 2020-06-16 DIAGNOSIS — B9689 Other specified bacterial agents as the cause of diseases classified elsewhere: Secondary | ICD-10-CM | POA: Diagnosis not present

## 2020-06-16 DIAGNOSIS — J208 Acute bronchitis due to other specified organisms: Secondary | ICD-10-CM | POA: Diagnosis not present

## 2020-06-16 DIAGNOSIS — R0600 Dyspnea, unspecified: Secondary | ICD-10-CM | POA: Diagnosis not present

## 2020-08-15 DIAGNOSIS — J189 Pneumonia, unspecified organism: Secondary | ICD-10-CM | POA: Diagnosis not present

## 2020-08-15 DIAGNOSIS — R059 Cough, unspecified: Secondary | ICD-10-CM | POA: Diagnosis not present

## 2020-08-18 DIAGNOSIS — R059 Cough, unspecified: Secondary | ICD-10-CM | POA: Diagnosis not present

## 2020-08-29 DIAGNOSIS — R059 Cough, unspecified: Secondary | ICD-10-CM | POA: Diagnosis not present

## 2020-08-29 DIAGNOSIS — Z131 Encounter for screening for diabetes mellitus: Secondary | ICD-10-CM | POA: Diagnosis not present

## 2020-08-29 DIAGNOSIS — Z13220 Encounter for screening for lipoid disorders: Secondary | ICD-10-CM | POA: Diagnosis not present

## 2020-08-29 DIAGNOSIS — Z125 Encounter for screening for malignant neoplasm of prostate: Secondary | ICD-10-CM | POA: Diagnosis not present

## 2020-08-29 DIAGNOSIS — R5383 Other fatigue: Secondary | ICD-10-CM | POA: Diagnosis not present

## 2020-09-22 DIAGNOSIS — I10 Essential (primary) hypertension: Secondary | ICD-10-CM | POA: Diagnosis not present

## 2020-09-22 DIAGNOSIS — Z712 Person consulting for explanation of examination or test findings: Secondary | ICD-10-CM | POA: Diagnosis not present

## 2020-09-22 DIAGNOSIS — R110 Nausea: Secondary | ICD-10-CM | POA: Diagnosis not present

## 2020-10-21 DIAGNOSIS — Z96652 Presence of left artificial knee joint: Secondary | ICD-10-CM | POA: Diagnosis not present

## 2020-10-21 DIAGNOSIS — R600 Localized edema: Secondary | ICD-10-CM | POA: Diagnosis not present

## 2020-10-21 DIAGNOSIS — I10 Essential (primary) hypertension: Secondary | ICD-10-CM | POA: Diagnosis not present

## 2020-10-31 DIAGNOSIS — R600 Localized edema: Secondary | ICD-10-CM | POA: Diagnosis not present

## 2020-10-31 DIAGNOSIS — G629 Polyneuropathy, unspecified: Secondary | ICD-10-CM | POA: Diagnosis not present

## 2020-11-01 DIAGNOSIS — R600 Localized edema: Secondary | ICD-10-CM | POA: Diagnosis not present

## 2020-11-08 DIAGNOSIS — M7989 Other specified soft tissue disorders: Secondary | ICD-10-CM | POA: Diagnosis not present

## 2020-11-09 DIAGNOSIS — H52223 Regular astigmatism, bilateral: Secondary | ICD-10-CM | POA: Diagnosis not present

## 2020-11-10 DIAGNOSIS — H5203 Hypermetropia, bilateral: Secondary | ICD-10-CM | POA: Diagnosis not present

## 2020-11-10 DIAGNOSIS — H524 Presbyopia: Secondary | ICD-10-CM | POA: Diagnosis not present

## 2020-11-10 DIAGNOSIS — H52209 Unspecified astigmatism, unspecified eye: Secondary | ICD-10-CM | POA: Diagnosis not present

## 2020-11-18 DIAGNOSIS — I10 Essential (primary) hypertension: Secondary | ICD-10-CM | POA: Diagnosis not present

## 2020-11-18 DIAGNOSIS — R600 Localized edema: Secondary | ICD-10-CM | POA: Diagnosis not present

## 2020-11-29 ENCOUNTER — Ambulatory Visit: Payer: Medicare HMO | Admitting: Podiatry

## 2020-11-29 ENCOUNTER — Other Ambulatory Visit: Payer: Self-pay

## 2020-11-29 ENCOUNTER — Ambulatory Visit (INDEPENDENT_AMBULATORY_CARE_PROVIDER_SITE_OTHER): Payer: Medicare HMO

## 2020-11-29 DIAGNOSIS — G629 Polyneuropathy, unspecified: Secondary | ICD-10-CM

## 2020-11-29 DIAGNOSIS — M79671 Pain in right foot: Secondary | ICD-10-CM | POA: Diagnosis not present

## 2020-11-29 DIAGNOSIS — R609 Edema, unspecified: Secondary | ICD-10-CM

## 2020-11-29 DIAGNOSIS — M79672 Pain in left foot: Secondary | ICD-10-CM | POA: Diagnosis not present

## 2020-11-29 DIAGNOSIS — I739 Peripheral vascular disease, unspecified: Secondary | ICD-10-CM

## 2020-11-29 MED ORDER — PREGABALIN 75 MG PO CAPS: 75.0000 mg | ORAL_CAPSULE | Freq: Two times a day (BID) | ORAL | 0 refills | Status: DC

## 2020-11-29 NOTE — Patient Instructions (Signed)
Pregabalin Capsules What is this medication? PREGABALIN (pre GAB a lin) treats nerve pain. It may also be used to prevent and control seizures in people with epilepsy. It works by calming overactive nerves in your body. This medicine may be used for other purposes; ask your health care provider or pharmacist if you have questions. COMMON BRAND NAME(S): Lyrica What should I tell my care team before I take this medication? They need to know if you have any of these conditions: Drug abuse or addiction Heart failure Kidney disease Lung disease Suicidal thoughts, plans or attempt An unusual or allergic reaction to pregabalin, other medications, foods, dyes, or preservatives Pregnant or trying to get pregnant Breast-feeding How should I use this medication? Take this medication by mouth with water. Take it as directed on the prescription label at the same time every day. You can take it with or without food. If it upsets your stomach, take it with food. Keep taking it unless your care team tells you to stop. A special MedGuide will be given to you by the pharmacist with each prescription and refill. Be sure to read this information carefully each time. Talk to your care team about the use of this medication in children. While it may be prescribed for children as young as 1 month for selected conditions, precautions do apply. Overdosage: If you think you have taken too much of this medicine contact a poison control center or emergency room at once. NOTE: This medicine is only for you. Do not share this medicine with others. What if I miss a dose? If you miss a dose, take it as soon as you can. If it is almost time for your next dose, take only that dose. Do not take double or extra doses. What may interact with this medication? Alcohol Antihistamines for allergy, cough, and cold Certain medications for anxiety or sleep Certain medications for blood pressure, heart disease Certain medications for  depression like amitriptyline, fluoxetine, sertraline Certain medications for diabetes, like pioglitazone, rosiglitazone Certain medications for seizures like phenobarbital, primidone General anesthetics like halothane, isoflurane, methoxyflurane, propofol Medications that relax muscles for surgery Narcotic medications for pain Phenothiazines like chlorpromazine, mesoridazine, prochlorperazine, thioridazine This list may not describe all possible interactions. Give your health care provider a list of all the medicines, herbs, non-prescription drugs, or dietary supplements you use. Also tell them if you smoke, drink alcohol, or use illegal drugs. Some items may interact with your medicine. What should I watch for while using this medication? Visit your care team for regular checks on your progress. Tell your care team if your symptoms do not start to get better or if they get worse. Do not suddenly stop taking this medication. You may develop a severe reaction. Your care team will tell you how much medication to take. If your care team wants you to stop the medication, the dose may be slowly lowered over time to avoid any side effects. You may get drowsy or dizzy. Do not drive, use machinery, or do anything that needs mental alertness until you know how this medication affects you. Do not stand up or sit up quickly, especially if you are an older patient. This reduces the risk of dizzy or fainting spells. Alcohol may interfere with the effect of this medication. Avoid alcoholic drinks. If you or your family notice any changes in your behavior, such as new or worsening depression, thoughts of harming yourself, anxiety, other unusual or disturbing thoughts, or memory loss, call your care   team right away. Wear a medical ID bracelet or chain if you are taking this medication for seizures. Carry a card that describes your condition. List the medications and doses you take on the card. This medication may  make it more difficult to father a child. Talk to your care team if you are concerned about your fertility. What side effects may I notice from receiving this medication? Side effects that you should report to your care team as soon as possible: Allergic reactions or angioedema-skin rash, itching, hives, swelling of the face, eyes, lips, tongue, arms, or legs, trouble swallowing or breathing Blurry vision Thoughts of suicide or self-harm, worsening mood, feelings of depression Trouble breathing Side effects that usually do not require medical attention (report to your care team if they continue or are bothersome): Dizziness Drowsiness Dry mouth Nausea Swelling of the ankles, feet, hands Vomiting Weight gain This list may not describe all possible side effects. Call your doctor for medical advice about side effects. You may report side effects to FDA at 1-800-FDA-1088. Where should I keep my medication? Keep out of the reach of children and pets. This medication can be abused. Keep it in a safe place to protect it from theft. Do not share it with anyone. It is only for you. Selling or giving away this medication is dangerous and against the law. Store at [employer] [address] (77 degrees F). Get rid of any unused medication after the expiration date. This medication may cause harm and death if it is taken by other adults, children, or pets. It is important to get rid of the medication as soon as you no longer need it, or it is expired. You can do this in two ways: Take the medication to a medication take-back program. Check with your pharmacy or law enforcement to find a location. If you cannot return the medication, check the label or package insert to see if the medication should be thrown out in the garbage or flushed down the toilet. If you are not sure, ask your care team. If it is safe to put it in the trash, take the medication out of the container. Mix the medication with cat litter, dirt, coffee  grounds, or other unwanted substance. Seal the mixture in a bag or container. Put it in the trash. NOTE: This sheet is a summary. It may not cover all possible information. If you have questions about this medicine, talk to your doctor, pharmacist, or health care provider.  2022 Elsevier/Gold Standard (2020-02-24 22:38:58)

## 2020-11-30 ENCOUNTER — Encounter (HOSPITAL_COMMUNITY): Payer: Self-pay

## 2020-12-02 NOTE — Progress Notes (Signed)
Subjective: 80 year old male presents the office today for concerns of swelling, burning sensations to his feet.  He states the burning is been ongoing for about 15 years.  He states that he wears compression socks which does help with symptoms about 50%.  He has been using over-the-counter creams as well which helped some.  He previously has been on gabapentin but states he has not been taking this in the last several weeks.  No ulcerations that he reports.  He has no other concerns.  Objective: AAO x3, NAD DP/PT pulses palpable 1/4 bilaterally, CRT less than 3 seconds Chronic bilateral lower extremity edema present. Sensation decreased with Semmes Weinstein monofilament. No ulcerations identified there is no area of pinpoint tenderness. No pain with calf compression, swelling, warmth, erythema  Assessment: Neuropathy; swelling  Plan: -All treatment options discussed with the patient including all alternatives, risks, complications.  -As he is not taking gabapentin but still has symptoms when trying to Lyrica.  Discussed side effects of medications. -ABI ordered -Daily foot inspection discussed.  -Patient encouraged to call the office with any questions, concerns, change in symptoms.   Trula Slade DPM

## 2020-12-05 ENCOUNTER — Other Ambulatory Visit: Payer: Self-pay | Admitting: Podiatry

## 2020-12-05 DIAGNOSIS — I739 Peripheral vascular disease, unspecified: Secondary | ICD-10-CM

## 2020-12-14 ENCOUNTER — Inpatient Hospital Stay (HOSPITAL_COMMUNITY): Admission: RE | Admit: 2020-12-14 | Payer: Medicare Other | Source: Ambulatory Visit

## 2020-12-16 DIAGNOSIS — R040 Epistaxis: Secondary | ICD-10-CM | POA: Diagnosis not present

## 2020-12-22 DIAGNOSIS — J310 Chronic rhinitis: Secondary | ICD-10-CM | POA: Diagnosis not present

## 2020-12-22 DIAGNOSIS — R040 Epistaxis: Secondary | ICD-10-CM | POA: Diagnosis not present

## 2020-12-23 ENCOUNTER — Telehealth: Payer: Self-pay | Admitting: *Deleted

## 2020-12-23 NOTE — Telephone Encounter (Signed)
Tried to call the patient to let him know that his insurance approved the Lyrica from 03/05/2020 thru 03/04/2021 and was calling to see if the patient had went to the pharmacy to pick up but the phone line went to a busy signal. Lattie Haw

## 2020-12-27 DIAGNOSIS — I444 Left anterior fascicular block: Secondary | ICD-10-CM | POA: Diagnosis not present

## 2020-12-27 DIAGNOSIS — R600 Localized edema: Secondary | ICD-10-CM | POA: Diagnosis not present

## 2020-12-27 DIAGNOSIS — I452 Bifascicular block: Secondary | ICD-10-CM | POA: Diagnosis not present

## 2020-12-27 DIAGNOSIS — Z0000 Encounter for general adult medical examination without abnormal findings: Secondary | ICD-10-CM | POA: Diagnosis not present

## 2020-12-29 ENCOUNTER — Ambulatory Visit: Payer: Medicare HMO | Admitting: Podiatry

## 2021-01-19 DIAGNOSIS — G4700 Insomnia, unspecified: Secondary | ICD-10-CM | POA: Diagnosis not present

## 2021-01-19 DIAGNOSIS — I10 Essential (primary) hypertension: Secondary | ICD-10-CM | POA: Diagnosis not present

## 2021-01-19 DIAGNOSIS — Z6841 Body Mass Index (BMI) 40.0 and over, adult: Secondary | ICD-10-CM | POA: Diagnosis not present

## 2021-01-19 DIAGNOSIS — Z8546 Personal history of malignant neoplasm of prostate: Secondary | ICD-10-CM | POA: Diagnosis not present

## 2021-01-19 DIAGNOSIS — E7800 Pure hypercholesterolemia, unspecified: Secondary | ICD-10-CM | POA: Diagnosis not present

## 2021-01-19 DIAGNOSIS — Z23 Encounter for immunization: Secondary | ICD-10-CM | POA: Diagnosis not present

## 2021-01-19 DIAGNOSIS — G2581 Restless legs syndrome: Secondary | ICD-10-CM | POA: Diagnosis not present

## 2021-01-19 DIAGNOSIS — E1142 Type 2 diabetes mellitus with diabetic polyneuropathy: Secondary | ICD-10-CM | POA: Diagnosis not present

## 2021-01-19 DIAGNOSIS — K219 Gastro-esophageal reflux disease without esophagitis: Secondary | ICD-10-CM | POA: Diagnosis not present

## 2021-01-19 DIAGNOSIS — I700 Atherosclerosis of aorta: Secondary | ICD-10-CM | POA: Diagnosis not present

## 2021-02-21 DIAGNOSIS — J209 Acute bronchitis, unspecified: Secondary | ICD-10-CM | POA: Diagnosis not present

## 2021-02-21 DIAGNOSIS — E1142 Type 2 diabetes mellitus with diabetic polyneuropathy: Secondary | ICD-10-CM | POA: Diagnosis not present

## 2021-02-21 DIAGNOSIS — Z20828 Contact with and (suspected) exposure to other viral communicable diseases: Secondary | ICD-10-CM | POA: Diagnosis not present

## 2021-05-23 DIAGNOSIS — Z1211 Encounter for screening for malignant neoplasm of colon: Secondary | ICD-10-CM | POA: Diagnosis not present

## 2021-05-23 DIAGNOSIS — Z6839 Body mass index (BMI) 39.0-39.9, adult: Secondary | ICD-10-CM | POA: Diagnosis not present

## 2021-05-23 DIAGNOSIS — Z8546 Personal history of malignant neoplasm of prostate: Secondary | ICD-10-CM | POA: Diagnosis not present

## 2021-05-23 DIAGNOSIS — I700 Atherosclerosis of aorta: Secondary | ICD-10-CM | POA: Diagnosis not present

## 2021-05-23 DIAGNOSIS — Z0000 Encounter for general adult medical examination without abnormal findings: Secondary | ICD-10-CM | POA: Diagnosis not present

## 2021-05-23 DIAGNOSIS — Z9181 History of falling: Secondary | ICD-10-CM | POA: Diagnosis not present

## 2021-05-23 DIAGNOSIS — E1142 Type 2 diabetes mellitus with diabetic polyneuropathy: Secondary | ICD-10-CM | POA: Diagnosis not present

## 2021-05-23 DIAGNOSIS — I10 Essential (primary) hypertension: Secondary | ICD-10-CM | POA: Diagnosis not present

## 2021-05-23 DIAGNOSIS — Z125 Encounter for screening for malignant neoplasm of prostate: Secondary | ICD-10-CM | POA: Diagnosis not present

## 2021-05-23 DIAGNOSIS — Z1331 Encounter for screening for depression: Secondary | ICD-10-CM | POA: Diagnosis not present

## 2021-05-23 DIAGNOSIS — G2581 Restless legs syndrome: Secondary | ICD-10-CM | POA: Diagnosis not present

## 2021-05-29 DIAGNOSIS — Z1211 Encounter for screening for malignant neoplasm of colon: Secondary | ICD-10-CM | POA: Diagnosis not present

## 2021-05-29 DIAGNOSIS — Z1212 Encounter for screening for malignant neoplasm of rectum: Secondary | ICD-10-CM | POA: Diagnosis not present

## 2021-06-07 LAB — COLOGUARD: COLOGUARD: NEGATIVE

## 2021-08-23 DIAGNOSIS — I700 Atherosclerosis of aorta: Secondary | ICD-10-CM | POA: Diagnosis not present

## 2021-08-23 DIAGNOSIS — M1990 Unspecified osteoarthritis, unspecified site: Secondary | ICD-10-CM | POA: Diagnosis not present

## 2021-08-23 DIAGNOSIS — Z6841 Body Mass Index (BMI) 40.0 and over, adult: Secondary | ICD-10-CM | POA: Diagnosis not present

## 2021-08-23 DIAGNOSIS — Z8546 Personal history of malignant neoplasm of prostate: Secondary | ICD-10-CM | POA: Diagnosis not present

## 2021-08-23 DIAGNOSIS — R110 Nausea: Secondary | ICD-10-CM | POA: Diagnosis not present

## 2021-08-23 DIAGNOSIS — G2581 Restless legs syndrome: Secondary | ICD-10-CM | POA: Diagnosis not present

## 2021-08-23 DIAGNOSIS — I10 Essential (primary) hypertension: Secondary | ICD-10-CM | POA: Diagnosis not present

## 2021-08-23 DIAGNOSIS — R600 Localized edema: Secondary | ICD-10-CM | POA: Diagnosis not present

## 2021-08-23 DIAGNOSIS — E1142 Type 2 diabetes mellitus with diabetic polyneuropathy: Secondary | ICD-10-CM | POA: Diagnosis not present

## 2021-08-23 DIAGNOSIS — K219 Gastro-esophageal reflux disease without esophagitis: Secondary | ICD-10-CM | POA: Diagnosis not present

## 2021-08-23 DIAGNOSIS — G4700 Insomnia, unspecified: Secondary | ICD-10-CM | POA: Diagnosis not present

## 2021-11-16 DIAGNOSIS — Z6838 Body mass index (BMI) 38.0-38.9, adult: Secondary | ICD-10-CM | POA: Diagnosis not present

## 2021-11-16 DIAGNOSIS — J209 Acute bronchitis, unspecified: Secondary | ICD-10-CM | POA: Diagnosis not present

## 2021-11-28 DIAGNOSIS — K219 Gastro-esophageal reflux disease without esophagitis: Secondary | ICD-10-CM | POA: Diagnosis not present

## 2021-11-28 DIAGNOSIS — E1142 Type 2 diabetes mellitus with diabetic polyneuropathy: Secondary | ICD-10-CM | POA: Diagnosis not present

## 2021-11-28 DIAGNOSIS — G4700 Insomnia, unspecified: Secondary | ICD-10-CM | POA: Diagnosis not present

## 2021-11-28 DIAGNOSIS — I700 Atherosclerosis of aorta: Secondary | ICD-10-CM | POA: Diagnosis not present

## 2021-11-28 DIAGNOSIS — G2581 Restless legs syndrome: Secondary | ICD-10-CM | POA: Diagnosis not present

## 2021-11-28 DIAGNOSIS — N1831 Chronic kidney disease, stage 3a: Secondary | ICD-10-CM | POA: Diagnosis not present

## 2021-11-28 DIAGNOSIS — I10 Essential (primary) hypertension: Secondary | ICD-10-CM | POA: Diagnosis not present

## 2021-11-28 DIAGNOSIS — R600 Localized edema: Secondary | ICD-10-CM | POA: Diagnosis not present

## 2021-11-28 DIAGNOSIS — Z6838 Body mass index (BMI) 38.0-38.9, adult: Secondary | ICD-10-CM | POA: Diagnosis not present

## 2021-11-28 DIAGNOSIS — L989 Disorder of the skin and subcutaneous tissue, unspecified: Secondary | ICD-10-CM | POA: Diagnosis not present

## 2021-12-05 DIAGNOSIS — L570 Actinic keratosis: Secondary | ICD-10-CM | POA: Diagnosis not present

## 2021-12-05 DIAGNOSIS — L219 Seborrheic dermatitis, unspecified: Secondary | ICD-10-CM | POA: Diagnosis not present

## 2021-12-07 DIAGNOSIS — H52223 Regular astigmatism, bilateral: Secondary | ICD-10-CM | POA: Diagnosis not present

## 2021-12-07 DIAGNOSIS — H353131 Nonexudative age-related macular degeneration, bilateral, early dry stage: Secondary | ICD-10-CM | POA: Diagnosis not present

## 2021-12-07 DIAGNOSIS — H524 Presbyopia: Secondary | ICD-10-CM | POA: Diagnosis not present

## 2021-12-07 DIAGNOSIS — H25813 Combined forms of age-related cataract, bilateral: Secondary | ICD-10-CM | POA: Diagnosis not present

## 2021-12-07 DIAGNOSIS — H5203 Hypermetropia, bilateral: Secondary | ICD-10-CM | POA: Diagnosis not present

## 2021-12-11 DIAGNOSIS — K5732 Diverticulitis of large intestine without perforation or abscess without bleeding: Secondary | ICD-10-CM | POA: Diagnosis not present

## 2021-12-11 DIAGNOSIS — N281 Cyst of kidney, acquired: Secondary | ICD-10-CM | POA: Diagnosis not present

## 2021-12-11 DIAGNOSIS — I10 Essential (primary) hypertension: Secondary | ICD-10-CM | POA: Diagnosis not present

## 2021-12-11 DIAGNOSIS — R000 Tachycardia, unspecified: Secondary | ICD-10-CM | POA: Diagnosis not present

## 2021-12-11 DIAGNOSIS — K5730 Diverticulosis of large intestine without perforation or abscess without bleeding: Secondary | ICD-10-CM | POA: Diagnosis not present

## 2021-12-11 DIAGNOSIS — R197 Diarrhea, unspecified: Secondary | ICD-10-CM | POA: Diagnosis not present

## 2021-12-11 DIAGNOSIS — R1084 Generalized abdominal pain: Secondary | ICD-10-CM | POA: Diagnosis not present

## 2021-12-11 DIAGNOSIS — C61 Malignant neoplasm of prostate: Secondary | ICD-10-CM | POA: Diagnosis not present

## 2021-12-11 DIAGNOSIS — R1030 Lower abdominal pain, unspecified: Secondary | ICD-10-CM | POA: Diagnosis not present

## 2021-12-11 DIAGNOSIS — N2889 Other specified disorders of kidney and ureter: Secondary | ICD-10-CM | POA: Diagnosis not present

## 2021-12-11 DIAGNOSIS — Z885 Allergy status to narcotic agent status: Secondary | ICD-10-CM | POA: Diagnosis not present

## 2021-12-11 DIAGNOSIS — R5381 Other malaise: Secondary | ICD-10-CM | POA: Diagnosis not present

## 2021-12-11 DIAGNOSIS — R630 Anorexia: Secondary | ICD-10-CM | POA: Diagnosis not present

## 2022-02-06 DIAGNOSIS — L219 Seborrheic dermatitis, unspecified: Secondary | ICD-10-CM | POA: Diagnosis not present

## 2022-03-01 DIAGNOSIS — E1142 Type 2 diabetes mellitus with diabetic polyneuropathy: Secondary | ICD-10-CM | POA: Diagnosis not present

## 2022-03-01 DIAGNOSIS — Z6837 Body mass index (BMI) 37.0-37.9, adult: Secondary | ICD-10-CM | POA: Diagnosis not present

## 2022-03-01 DIAGNOSIS — G4700 Insomnia, unspecified: Secondary | ICD-10-CM | POA: Diagnosis not present

## 2022-03-01 DIAGNOSIS — I700 Atherosclerosis of aorta: Secondary | ICD-10-CM | POA: Diagnosis not present

## 2022-03-01 DIAGNOSIS — E782 Mixed hyperlipidemia: Secondary | ICD-10-CM | POA: Diagnosis not present

## 2022-03-01 DIAGNOSIS — N1831 Chronic kidney disease, stage 3a: Secondary | ICD-10-CM | POA: Diagnosis not present

## 2022-03-01 DIAGNOSIS — G2581 Restless legs syndrome: Secondary | ICD-10-CM | POA: Diagnosis not present

## 2022-03-01 DIAGNOSIS — I10 Essential (primary) hypertension: Secondary | ICD-10-CM | POA: Diagnosis not present

## 2022-05-01 DIAGNOSIS — L219 Seborrheic dermatitis, unspecified: Secondary | ICD-10-CM | POA: Diagnosis not present

## 2022-05-06 DIAGNOSIS — R69 Illness, unspecified: Secondary | ICD-10-CM | POA: Diagnosis not present

## 2022-05-31 DIAGNOSIS — I700 Atherosclerosis of aorta: Secondary | ICD-10-CM | POA: Diagnosis not present

## 2022-05-31 DIAGNOSIS — I10 Essential (primary) hypertension: Secondary | ICD-10-CM | POA: Diagnosis not present

## 2022-05-31 DIAGNOSIS — N1831 Chronic kidney disease, stage 3a: Secondary | ICD-10-CM | POA: Diagnosis not present

## 2022-05-31 DIAGNOSIS — Z8546 Personal history of malignant neoplasm of prostate: Secondary | ICD-10-CM | POA: Diagnosis not present

## 2022-05-31 DIAGNOSIS — G2581 Restless legs syndrome: Secondary | ICD-10-CM | POA: Diagnosis not present

## 2022-05-31 DIAGNOSIS — Z1331 Encounter for screening for depression: Secondary | ICD-10-CM | POA: Diagnosis not present

## 2022-05-31 DIAGNOSIS — G4700 Insomnia, unspecified: Secondary | ICD-10-CM | POA: Diagnosis not present

## 2022-05-31 DIAGNOSIS — Z6838 Body mass index (BMI) 38.0-38.9, adult: Secondary | ICD-10-CM | POA: Diagnosis not present

## 2022-05-31 DIAGNOSIS — E1142 Type 2 diabetes mellitus with diabetic polyneuropathy: Secondary | ICD-10-CM | POA: Diagnosis not present

## 2022-05-31 DIAGNOSIS — Z0000 Encounter for general adult medical examination without abnormal findings: Secondary | ICD-10-CM | POA: Diagnosis not present

## 2022-09-03 DIAGNOSIS — G2581 Restless legs syndrome: Secondary | ICD-10-CM | POA: Diagnosis not present

## 2022-09-03 DIAGNOSIS — E1142 Type 2 diabetes mellitus with diabetic polyneuropathy: Secondary | ICD-10-CM | POA: Diagnosis not present

## 2022-09-03 DIAGNOSIS — E7800 Pure hypercholesterolemia, unspecified: Secondary | ICD-10-CM | POA: Diagnosis not present

## 2022-09-03 DIAGNOSIS — I700 Atherosclerosis of aorta: Secondary | ICD-10-CM | POA: Diagnosis not present

## 2022-09-03 DIAGNOSIS — Z1339 Encounter for screening examination for other mental health and behavioral disorders: Secondary | ICD-10-CM | POA: Diagnosis not present

## 2022-09-03 DIAGNOSIS — N1831 Chronic kidney disease, stage 3a: Secondary | ICD-10-CM | POA: Diagnosis not present

## 2022-09-03 DIAGNOSIS — M1990 Unspecified osteoarthritis, unspecified site: Secondary | ICD-10-CM | POA: Diagnosis not present

## 2022-09-03 DIAGNOSIS — G4700 Insomnia, unspecified: Secondary | ICD-10-CM | POA: Diagnosis not present

## 2022-09-03 DIAGNOSIS — I10 Essential (primary) hypertension: Secondary | ICD-10-CM | POA: Diagnosis not present

## 2022-10-09 ENCOUNTER — Ambulatory Visit: Payer: Medicare HMO | Admitting: Podiatry

## 2022-10-09 ENCOUNTER — Encounter: Payer: Self-pay | Admitting: Podiatry

## 2022-10-09 ENCOUNTER — Ambulatory Visit (INDEPENDENT_AMBULATORY_CARE_PROVIDER_SITE_OTHER): Payer: Medicare HMO

## 2022-10-09 DIAGNOSIS — R609 Edema, unspecified: Secondary | ICD-10-CM | POA: Diagnosis not present

## 2022-10-09 DIAGNOSIS — M79671 Pain in right foot: Secondary | ICD-10-CM

## 2022-10-09 DIAGNOSIS — G629 Polyneuropathy, unspecified: Secondary | ICD-10-CM

## 2022-10-09 DIAGNOSIS — M79672 Pain in left foot: Secondary | ICD-10-CM

## 2022-10-09 DIAGNOSIS — I739 Peripheral vascular disease, unspecified: Secondary | ICD-10-CM

## 2022-10-09 MED ORDER — PREGABALIN 75 MG PO CAPS: 75.0000 mg | ORAL_CAPSULE | Freq: Two times a day (BID) | ORAL | 0 refills | Status: AC

## 2022-10-09 NOTE — Progress Notes (Signed)
  Subjective:  Patient ID: [name], male    DOB: 07-25-1940,   MRN: [id number]  Chief Complaint  Patient presents with   Foot Pain    pain in both feet    82 y.o. male presents for bilateral foot pain that has been going on for years . Patient is not diabetic but has dealt with neuropathy. Appears to have been on gabapentin in the past and attempted to try lyrica but insurance would not cover. Following with [facility]ology at [facility] and relates circulation was doing well.  Denies any other pedal complaints. Denies n/v/f/c.   Past Medical History:  Diagnosis Date   Hypertension     Objective:  Physical Exam: Vascular: DP/PT pulses 1/4 bilateral. CFT <3 seconds. Normal hair growth on digits. No edema.  Skin. No lacerations or abrasions bilateral feet.  Musculoskeletal: MMT 5/5 bilateral lower extremities in DF, PF, Inversion and Eversion. Deceased ROM in DF of ankle joint. No pain to palpation about the foot.  Neurological: Sensation diminished to light touch. Negative tinel.   Assessment:   1. Foot pain, bilateral   2. Neuropathy   3. Swelling   4. PAD (peripheral artery disease) (HCC)      Plan:  Patient was evaluated and treated and all questions answered. X-rays reviewed and discussed with patient. No acute fractures or dislocations noted. Spurring noted to inferior and posterior calcaneus bilateral.  Discussed neuropathy and etiology as well as treatment with patient.  Radiographs reviewed and discussed with patient.  -Discussed supportive shoes at all times and checking feet regularly.  -Will try and reorder lyrica as other medications have not worked for him in the past.  -Discussed referral to neurology for further work-up and possible other options have we have nearly exhausted all options for him.  -Patient to return as needed in future    Louann Sjogren, DPM

## 2023-03-06 DIAGNOSIS — L0390 Cellulitis, unspecified: Secondary | ICD-10-CM | POA: Diagnosis not present

## 2023-03-06 DIAGNOSIS — L02212 Cutaneous abscess of back [any part, except buttock]: Secondary | ICD-10-CM | POA: Diagnosis not present

## 2023-03-19 ENCOUNTER — Other Ambulatory Visit (HOSPITAL_COMMUNITY): Payer: Self-pay

## 2023-03-20 ENCOUNTER — Other Ambulatory Visit (HOSPITAL_COMMUNITY): Payer: Self-pay

## 2023-03-20 ENCOUNTER — Other Ambulatory Visit: Payer: Self-pay

## 2023-03-20 MED ORDER — FUROSEMIDE 20 MG PO TABS
20.0000 mg | ORAL_TABLET | ORAL | 1 refills | Status: AC | PRN
  Filled 2023-03-20: qty 45, 90d supply, fill #0

## 2023-03-20 MED ORDER — OMEPRAZOLE 20 MG PO CPDR
20.0000 mg | DELAYED_RELEASE_CAPSULE | Freq: Every day | ORAL | 1 refills | Status: AC
  Filled 2023-03-20: qty 90, 90d supply, fill #0

## 2023-03-20 MED ORDER — ROPINIROLE HCL 1 MG PO TABS
1.0000 mg | ORAL_TABLET | Freq: Every day | ORAL | 1 refills | Status: AC
  Filled 2023-03-20: qty 90, 90d supply, fill #0

## 2023-03-20 MED ORDER — PRAVASTATIN SODIUM 40 MG PO TABS
40.0000 mg | ORAL_TABLET | Freq: Every day | ORAL | 1 refills | Status: AC
  Filled 2023-03-20: qty 90, 90d supply, fill #0

## 2023-03-20 MED ORDER — LISINOPRIL 5 MG PO TABS
5.0000 mg | ORAL_TABLET | Freq: Every day | ORAL | 1 refills | Status: AC
  Filled 2023-03-20: qty 90, 90d supply, fill #0

## 2023-03-21 ENCOUNTER — Other Ambulatory Visit: Payer: Self-pay

## 2023-04-01 ENCOUNTER — Other Ambulatory Visit (HOSPITAL_COMMUNITY): Payer: Self-pay

## 2023-06-06 DIAGNOSIS — I700 Atherosclerosis of aorta: Secondary | ICD-10-CM | POA: Diagnosis not present

## 2023-06-06 DIAGNOSIS — E782 Mixed hyperlipidemia: Secondary | ICD-10-CM | POA: Diagnosis not present

## 2023-06-06 DIAGNOSIS — I872 Venous insufficiency (chronic) (peripheral): Secondary | ICD-10-CM | POA: Diagnosis not present

## 2023-06-06 DIAGNOSIS — K219 Gastro-esophageal reflux disease without esophagitis: Secondary | ICD-10-CM | POA: Diagnosis not present

## 2023-06-06 DIAGNOSIS — Z8546 Personal history of malignant neoplasm of prostate: Secondary | ICD-10-CM | POA: Diagnosis not present

## 2023-06-06 DIAGNOSIS — I10 Essential (primary) hypertension: Secondary | ICD-10-CM | POA: Diagnosis not present

## 2023-06-06 DIAGNOSIS — Z0000 Encounter for general adult medical examination without abnormal findings: Secondary | ICD-10-CM | POA: Diagnosis not present

## 2023-06-06 DIAGNOSIS — Z125 Encounter for screening for malignant neoplasm of prostate: Secondary | ICD-10-CM | POA: Diagnosis not present

## 2023-06-06 DIAGNOSIS — G4700 Insomnia, unspecified: Secondary | ICD-10-CM | POA: Diagnosis not present

## 2023-06-06 DIAGNOSIS — E1142 Type 2 diabetes mellitus with diabetic polyneuropathy: Secondary | ICD-10-CM | POA: Diagnosis not present

## 2023-06-06 DIAGNOSIS — N1831 Chronic kidney disease, stage 3a: Secondary | ICD-10-CM | POA: Diagnosis not present

## 2023-06-06 DIAGNOSIS — G2581 Restless legs syndrome: Secondary | ICD-10-CM | POA: Diagnosis not present

## 2023-06-17 ENCOUNTER — Encounter: Payer: Self-pay | Admitting: Pharmacist

## 2023-06-17 NOTE — Progress Notes (Signed)
 Pharmacy Quality Measure Review  Reviewed patient for adherence measure for cholesterol (statin), diabetes, and hypertension (ACEi/ARB) medications this calendar year.   Medication: lisinopril 5 mg  Last fill date: 04/01/2023 for 90 day supply  Medication: pravastatin 40 mg  Last fill date: 04/01/2023 for 90 day supply  Patient will not enter MAD unless a prescription drug claim for a non-insulin antidiabetic process in 2025. Patient failed MAD in 2024 and was enrolled into patient assistance program for 2024 and now for all of 2025.   This patient is appearing on a report for being at risk of failing the Glycemic Status Assessment in Diabetes and Controlling Blood Pressure measure this calendar year.   Last documented BP 118/68 on 06/06/2023  Last documented A1c or GMI  on 5.3 04//05/2023.  Statin Therapy for Patients with Cardiovascular Disease (SPC) and Statin Use in Persons with Diabetes (SUPD) closed, patient has filled pravastatin 40 mg.   Calvert Caul, PharmD Clinical Pharmacist  Direct Dial: [phone]

## 2023-09-26 DIAGNOSIS — E663 Overweight: Secondary | ICD-10-CM | POA: Diagnosis not present

## 2023-09-26 DIAGNOSIS — R1032 Left lower quadrant pain: Secondary | ICD-10-CM | POA: Diagnosis not present

## 2023-09-26 DIAGNOSIS — Z6836 Body mass index (BMI) 36.0-36.9, adult: Secondary | ICD-10-CM | POA: Diagnosis not present

## 2023-09-30 DIAGNOSIS — R1032 Left lower quadrant pain: Secondary | ICD-10-CM | POA: Diagnosis not present

## 2023-10-01 ENCOUNTER — Telehealth: Payer: Self-pay

## 2023-10-01 NOTE — Telephone Encounter (Signed)
 Completed reorder for for Ozempic and faxed to Lauraine Sic at [facility] to be sent to [facility]

## 2023-10-16 DIAGNOSIS — K4090 Unilateral inguinal hernia, without obstruction or gangrene, not specified as recurrent: Secondary | ICD-10-CM | POA: Diagnosis not present

## 2023-10-21 DIAGNOSIS — N1831 Chronic kidney disease, stage 3a: Secondary | ICD-10-CM | POA: Diagnosis not present

## 2023-10-21 DIAGNOSIS — K4090 Unilateral inguinal hernia, without obstruction or gangrene, not specified as recurrent: Secondary | ICD-10-CM | POA: Diagnosis not present

## 2023-10-21 DIAGNOSIS — G4733 Obstructive sleep apnea (adult) (pediatric): Secondary | ICD-10-CM | POA: Diagnosis not present

## 2023-10-21 DIAGNOSIS — I872 Venous insufficiency (chronic) (peripheral): Secondary | ICD-10-CM | POA: Diagnosis not present

## 2023-10-21 DIAGNOSIS — M1990 Unspecified osteoarthritis, unspecified site: Secondary | ICD-10-CM | POA: Diagnosis not present

## 2023-10-21 DIAGNOSIS — E1122 Type 2 diabetes mellitus with diabetic chronic kidney disease: Secondary | ICD-10-CM | POA: Diagnosis not present

## 2023-10-21 DIAGNOSIS — Z91198 Patient's noncompliance with other medical treatment and regimen for other reason: Secondary | ICD-10-CM | POA: Diagnosis not present

## 2023-10-21 DIAGNOSIS — I129 Hypertensive chronic kidney disease with stage 1 through stage 4 chronic kidney disease, or unspecified chronic kidney disease: Secondary | ICD-10-CM | POA: Diagnosis not present

## 2023-10-21 DIAGNOSIS — Z87891 Personal history of nicotine dependence: Secondary | ICD-10-CM | POA: Diagnosis not present

## 2023-10-21 DIAGNOSIS — K219 Gastro-esophageal reflux disease without esophagitis: Secondary | ICD-10-CM | POA: Diagnosis not present

## 2023-10-21 DIAGNOSIS — Z8546 Personal history of malignant neoplasm of prostate: Secondary | ICD-10-CM | POA: Diagnosis not present

## 2023-10-21 DIAGNOSIS — G4700 Insomnia, unspecified: Secondary | ICD-10-CM | POA: Diagnosis not present

## 2023-10-21 DIAGNOSIS — Z6836 Body mass index (BMI) 36.0-36.9, adult: Secondary | ICD-10-CM | POA: Diagnosis not present

## 2023-10-21 DIAGNOSIS — I10 Essential (primary) hypertension: Secondary | ICD-10-CM | POA: Diagnosis not present

## 2023-10-21 DIAGNOSIS — M549 Dorsalgia, unspecified: Secondary | ICD-10-CM | POA: Diagnosis not present

## 2023-10-21 DIAGNOSIS — G8929 Other chronic pain: Secondary | ICD-10-CM | POA: Diagnosis not present

## 2023-10-21 DIAGNOSIS — E7800 Pure hypercholesterolemia, unspecified: Secondary | ICD-10-CM | POA: Diagnosis not present

## 2023-10-21 DIAGNOSIS — E1142 Type 2 diabetes mellitus with diabetic polyneuropathy: Secondary | ICD-10-CM | POA: Diagnosis not present

## 2023-10-21 DIAGNOSIS — I700 Atherosclerosis of aorta: Secondary | ICD-10-CM | POA: Diagnosis not present

## 2023-10-21 DIAGNOSIS — Z79899 Other long term (current) drug therapy: Secondary | ICD-10-CM | POA: Diagnosis not present

## 2023-10-21 DIAGNOSIS — Z7985 Long-term (current) use of injectable non-insulin antidiabetic drugs: Secondary | ICD-10-CM | POA: Diagnosis not present

## 2023-10-21 DIAGNOSIS — E669 Obesity, unspecified: Secondary | ICD-10-CM | POA: Diagnosis not present

## 2023-10-21 DIAGNOSIS — G2581 Restless legs syndrome: Secondary | ICD-10-CM | POA: Diagnosis not present

## 2023-12-09 DIAGNOSIS — G4700 Insomnia, unspecified: Secondary | ICD-10-CM | POA: Diagnosis not present

## 2023-12-09 DIAGNOSIS — Z6836 Body mass index (BMI) 36.0-36.9, adult: Secondary | ICD-10-CM | POA: Diagnosis not present

## 2023-12-09 DIAGNOSIS — E1142 Type 2 diabetes mellitus with diabetic polyneuropathy: Secondary | ICD-10-CM | POA: Diagnosis not present

## 2023-12-09 DIAGNOSIS — N1831 Chronic kidney disease, stage 3a: Secondary | ICD-10-CM | POA: Diagnosis not present

## 2023-12-09 DIAGNOSIS — M1990 Unspecified osteoarthritis, unspecified site: Secondary | ICD-10-CM | POA: Diagnosis not present

## 2023-12-09 DIAGNOSIS — E782 Mixed hyperlipidemia: Secondary | ICD-10-CM | POA: Diagnosis not present

## 2023-12-09 DIAGNOSIS — I10 Essential (primary) hypertension: Secondary | ICD-10-CM | POA: Diagnosis not present

## 2023-12-09 DIAGNOSIS — I872 Venous insufficiency (chronic) (peripheral): Secondary | ICD-10-CM | POA: Diagnosis not present

## 2023-12-09 DIAGNOSIS — G2581 Restless legs syndrome: Secondary | ICD-10-CM | POA: Diagnosis not present

## 2023-12-09 DIAGNOSIS — K219 Gastro-esophageal reflux disease without esophagitis: Secondary | ICD-10-CM | POA: Diagnosis not present

## 2023-12-09 DIAGNOSIS — I700 Atherosclerosis of aorta: Secondary | ICD-10-CM | POA: Diagnosis not present

## 2023-12-11 DIAGNOSIS — H353131 Nonexudative age-related macular degeneration, bilateral, early dry stage: Secondary | ICD-10-CM | POA: Diagnosis not present

## 2023-12-11 DIAGNOSIS — H25813 Combined forms of age-related cataract, bilateral: Secondary | ICD-10-CM | POA: Diagnosis not present

## 2024-01-10 DIAGNOSIS — H353131 Nonexudative age-related macular degeneration, bilateral, early dry stage: Secondary | ICD-10-CM | POA: Diagnosis not present

## 2024-01-10 DIAGNOSIS — H2513 Age-related nuclear cataract, bilateral: Secondary | ICD-10-CM | POA: Diagnosis not present

## 2024-01-27 DIAGNOSIS — H2512 Age-related nuclear cataract, left eye: Secondary | ICD-10-CM | POA: Diagnosis not present

## 2024-02-12 DIAGNOSIS — H2511 Age-related nuclear cataract, right eye: Secondary | ICD-10-CM | POA: Diagnosis not present
